# Patient Record
Sex: Male | Born: 1965 | Race: White | Hispanic: No | Marital: Married | Smoking: Never smoker
Health system: Southern US, Community
[De-identification: ages and names within clinical notes are randomized; demographics above are authoritative.]

## PROBLEM LIST (undated history)

## (undated) DIAGNOSIS — M1611 Unilateral primary osteoarthritis, right hip: Secondary | ICD-10-CM

## (undated) DIAGNOSIS — F419 Anxiety disorder, unspecified: Secondary | ICD-10-CM

## (undated) DIAGNOSIS — M48061 Spinal stenosis, lumbar region without neurogenic claudication: Secondary | ICD-10-CM

## (undated) DIAGNOSIS — Z8619 Personal history of other infectious and parasitic diseases: Secondary | ICD-10-CM

## (undated) DIAGNOSIS — R931 Abnormal findings on diagnostic imaging of heart and coronary circulation: Secondary | ICD-10-CM

## (undated) DIAGNOSIS — I499 Cardiac arrhythmia, unspecified: Secondary | ICD-10-CM

## (undated) DIAGNOSIS — I4891 Unspecified atrial fibrillation: Secondary | ICD-10-CM

## (undated) DIAGNOSIS — Z7901 Long term (current) use of anticoagulants: Secondary | ICD-10-CM

## (undated) DIAGNOSIS — I209 Angina pectoris, unspecified: Secondary | ICD-10-CM

## (undated) DIAGNOSIS — J302 Other seasonal allergic rhinitis: Secondary | ICD-10-CM

## (undated) DIAGNOSIS — L219 Seborrheic dermatitis, unspecified: Secondary | ICD-10-CM

## (undated) DIAGNOSIS — E669 Obesity, unspecified: Secondary | ICD-10-CM

## (undated) DIAGNOSIS — G4733 Obstructive sleep apnea (adult) (pediatric): Secondary | ICD-10-CM

## (undated) DIAGNOSIS — E269 Hyperaldosteronism, unspecified: Secondary | ICD-10-CM

## (undated) DIAGNOSIS — E2601 Conn's syndrome: Secondary | ICD-10-CM

## (undated) DIAGNOSIS — Z9889 Other specified postprocedural states: Secondary | ICD-10-CM

## (undated) DIAGNOSIS — D696 Thrombocytopenia, unspecified: Secondary | ICD-10-CM

## (undated) DIAGNOSIS — L409 Psoriasis, unspecified: Secondary | ICD-10-CM

## (undated) DIAGNOSIS — Z79620 Long term (current) use of immunosuppressive biologic: Secondary | ICD-10-CM

## (undated) DIAGNOSIS — M1990 Unspecified osteoarthritis, unspecified site: Secondary | ICD-10-CM

## (undated) DIAGNOSIS — I2510 Atherosclerotic heart disease of native coronary artery without angina pectoris: Secondary | ICD-10-CM

## (undated) DIAGNOSIS — I700 Atherosclerosis of aorta: Secondary | ICD-10-CM

## (undated) DIAGNOSIS — F329 Major depressive disorder, single episode, unspecified: Secondary | ICD-10-CM

## (undated) DIAGNOSIS — E119 Type 2 diabetes mellitus without complications: Secondary | ICD-10-CM

## (undated) DIAGNOSIS — I10 Essential (primary) hypertension: Secondary | ICD-10-CM

## (undated) DIAGNOSIS — G4710 Hypersomnia, unspecified: Secondary | ICD-10-CM

## (undated) DIAGNOSIS — G4730 Sleep apnea, unspecified: Secondary | ICD-10-CM

## (undated) DIAGNOSIS — I4892 Unspecified atrial flutter: Secondary | ICD-10-CM

## (undated) DIAGNOSIS — Z87898 Personal history of other specified conditions: Secondary | ICD-10-CM

## (undated) DIAGNOSIS — R945 Abnormal results of liver function studies: Secondary | ICD-10-CM

## (undated) DIAGNOSIS — E538 Deficiency of other specified B group vitamins: Secondary | ICD-10-CM

## (undated) DIAGNOSIS — F32A Depression, unspecified: Secondary | ICD-10-CM

## (undated) DIAGNOSIS — G629 Polyneuropathy, unspecified: Secondary | ICD-10-CM

## (undated) DIAGNOSIS — E1142 Type 2 diabetes mellitus with diabetic polyneuropathy: Secondary | ICD-10-CM

## (undated) DIAGNOSIS — E785 Hyperlipidemia, unspecified: Secondary | ICD-10-CM

## (undated) DIAGNOSIS — E559 Vitamin D deficiency, unspecified: Secondary | ICD-10-CM

## (undated) DIAGNOSIS — I208 Other forms of angina pectoris: Secondary | ICD-10-CM

## (undated) HISTORY — PX: VASECTOMY: SHX75

## (undated) HISTORY — DX: Unspecified osteoarthritis, unspecified site: M19.90

## (undated) HISTORY — DX: Cardiac arrhythmia, unspecified: I49.9

## (undated) HISTORY — DX: Unspecified atrial flutter: I48.92

## (undated) HISTORY — DX: Personal history of other infectious and parasitic diseases: Z86.19

## (undated) HISTORY — DX: Other seasonal allergic rhinitis: J30.2

## (undated) HISTORY — DX: Conn's syndrome: E26.01

## (undated) HISTORY — DX: Depression, unspecified: F32.A

## (undated) HISTORY — PX: OTHER SURGICAL HISTORY: SHX169

## (undated) HISTORY — DX: Major depressive disorder, single episode, unspecified: F32.9

## (undated) HISTORY — PX: COLONOSCOPY: SHX174

## (undated) HISTORY — DX: Essential (primary) hypertension: I10

## (undated) HISTORY — PX: CARDIAC ELECTROPHYSIOLOGY MAPPING AND ABLATION: SHX1292

## (undated) HISTORY — DX: Personal history of other specified conditions: Z87.898

## (undated) HISTORY — DX: Other forms of angina pectoris: I20.8

## (undated) HISTORY — DX: Other specified postprocedural states: Z98.890

## (undated) HISTORY — DX: Psoriasis, unspecified: L40.9

## (undated) HISTORY — DX: Hypersomnia, unspecified: G47.10

## (undated) HISTORY — DX: Type 2 diabetes mellitus without complications: E11.9

## (undated) HISTORY — DX: Abnormal results of liver function studies: R94.5

## (undated) HISTORY — DX: Spinal stenosis, lumbar region without neurogenic claudication: M48.061

## (undated) HISTORY — PX: TONSILLECTOMY: SUR1361

## (undated) HISTORY — PX: ADRENALECTOMY: SHX876

## (undated) HISTORY — DX: Seborrheic dermatitis, unspecified: L21.9

## (undated) HISTORY — DX: Hyperlipidemia, unspecified: E78.5

## (undated) HISTORY — DX: Sleep apnea, unspecified: G47.30

## (undated) HISTORY — DX: Anxiety disorder, unspecified: F41.9

## (undated) HISTORY — PX: UVULOPALATOPHARYNGOPLASTY: SHX827

---

## 2005-03-21 ENCOUNTER — Ambulatory Visit: Payer: Self-pay | Admitting: Unknown Physician Specialty

## 2006-02-05 ENCOUNTER — Other Ambulatory Visit: Payer: Self-pay

## 2006-02-05 ENCOUNTER — Emergency Department: Payer: Self-pay | Admitting: Emergency Medicine

## 2006-09-22 ENCOUNTER — Inpatient Hospital Stay: Payer: Self-pay | Admitting: Internal Medicine

## 2007-05-22 ENCOUNTER — Ambulatory Visit: Payer: Self-pay

## 2010-06-12 ENCOUNTER — Emergency Department: Payer: Self-pay | Admitting: Emergency Medicine

## 2013-12-05 ENCOUNTER — Ambulatory Visit: Payer: Self-pay | Admitting: Unknown Physician Specialty

## 2014-02-04 ENCOUNTER — Ambulatory Visit: Payer: Self-pay | Admitting: Unknown Physician Specialty

## 2014-02-12 HISTORY — PX: JOINT REPLACEMENT: SHX530

## 2014-02-12 HISTORY — PX: MEDIAL PARTIAL KNEE REPLACEMENT: SHX5965

## 2014-04-17 DIAGNOSIS — Z471 Aftercare following joint replacement surgery: Secondary | ICD-10-CM | POA: Insufficient documentation

## 2014-07-31 DIAGNOSIS — M545 Low back pain, unspecified: Secondary | ICD-10-CM | POA: Insufficient documentation

## 2014-07-31 DIAGNOSIS — Z96652 Presence of left artificial knee joint: Secondary | ICD-10-CM | POA: Insufficient documentation

## 2014-08-07 ENCOUNTER — Ambulatory Visit: Payer: Self-pay | Admitting: Unknown Physician Specialty

## 2014-08-22 DIAGNOSIS — M5416 Radiculopathy, lumbar region: Secondary | ICD-10-CM | POA: Insufficient documentation

## 2014-08-22 DIAGNOSIS — M48062 Spinal stenosis, lumbar region with neurogenic claudication: Secondary | ICD-10-CM | POA: Insufficient documentation

## 2016-03-10 HISTORY — PX: LAPAROSCOPIC ADRENALECTOMY: SUR752

## 2016-07-13 DIAGNOSIS — Z9889 Other specified postprocedural states: Secondary | ICD-10-CM

## 2016-07-13 HISTORY — DX: Other specified postprocedural states: Z98.890

## 2016-07-13 HISTORY — PX: CARDIAC ELECTROPHYSIOLOGY MAPPING AND ABLATION: SHX1292

## 2018-10-26 ENCOUNTER — Other Ambulatory Visit: Payer: Self-pay | Admitting: Student

## 2018-10-26 DIAGNOSIS — M5441 Lumbago with sciatica, right side: Principal | ICD-10-CM

## 2018-10-26 DIAGNOSIS — G8929 Other chronic pain: Secondary | ICD-10-CM

## 2018-10-26 DIAGNOSIS — M5442 Lumbago with sciatica, left side: Principal | ICD-10-CM

## 2018-11-19 ENCOUNTER — Ambulatory Visit
Admission: RE | Admit: 2018-11-19 | Discharge: 2018-11-19 | Disposition: A | Discharge status: Home / Self Care | Payer: PRIVATE HEALTH INSURANCE | Source: Ambulatory Visit | Attending: Student | Admitting: Student

## 2018-11-19 DIAGNOSIS — M5442 Lumbago with sciatica, left side: Secondary | ICD-10-CM | POA: Insufficient documentation

## 2018-11-19 DIAGNOSIS — M5441 Lumbago with sciatica, right side: Secondary | ICD-10-CM | POA: Diagnosis present

## 2018-11-19 DIAGNOSIS — G8929 Other chronic pain: Secondary | ICD-10-CM | POA: Insufficient documentation

## 2018-12-24 ENCOUNTER — Encounter: Payer: Self-pay | Admitting: Physical Therapy

## 2018-12-24 ENCOUNTER — Ambulatory Visit: Payer: PRIVATE HEALTH INSURANCE | Attending: Student | Admitting: Physical Therapy

## 2018-12-24 DIAGNOSIS — E785 Hyperlipidemia, unspecified: Secondary | ICD-10-CM

## 2018-12-24 DIAGNOSIS — E1159 Type 2 diabetes mellitus with other circulatory complications: Secondary | ICD-10-CM | POA: Insufficient documentation

## 2018-12-24 DIAGNOSIS — G8929 Other chronic pain: Secondary | ICD-10-CM | POA: Diagnosis present

## 2018-12-24 DIAGNOSIS — M5441 Lumbago with sciatica, right side: Secondary | ICD-10-CM | POA: Diagnosis present

## 2018-12-24 DIAGNOSIS — I10 Essential (primary) hypertension: Secondary | ICD-10-CM

## 2018-12-24 DIAGNOSIS — G4733 Obstructive sleep apnea (adult) (pediatric): Secondary | ICD-10-CM | POA: Insufficient documentation

## 2018-12-24 DIAGNOSIS — I4892 Unspecified atrial flutter: Secondary | ICD-10-CM

## 2018-12-24 DIAGNOSIS — I152 Hypertension secondary to endocrine disorders: Secondary | ICD-10-CM | POA: Insufficient documentation

## 2018-12-24 DIAGNOSIS — R262 Difficulty in walking, not elsewhere classified: Secondary | ICD-10-CM | POA: Insufficient documentation

## 2018-12-24 DIAGNOSIS — M5442 Lumbago with sciatica, left side: Secondary | ICD-10-CM | POA: Diagnosis present

## 2018-12-24 DIAGNOSIS — R202 Paresthesia of skin: Secondary | ICD-10-CM | POA: Diagnosis present

## 2018-12-24 DIAGNOSIS — G4710 Hypersomnia, unspecified: Secondary | ICD-10-CM | POA: Insufficient documentation

## 2018-12-24 DIAGNOSIS — G4730 Sleep apnea, unspecified: Secondary | ICD-10-CM

## 2018-12-24 DIAGNOSIS — L409 Psoriasis, unspecified: Secondary | ICD-10-CM | POA: Insufficient documentation

## 2018-12-24 DIAGNOSIS — M62838 Other muscle spasm: Secondary | ICD-10-CM | POA: Insufficient documentation

## 2018-12-24 DIAGNOSIS — E2609 Other primary hyperaldosteronism: Secondary | ICD-10-CM | POA: Insufficient documentation

## 2018-12-24 DIAGNOSIS — E1169 Type 2 diabetes mellitus with other specified complication: Secondary | ICD-10-CM | POA: Insufficient documentation

## 2018-12-24 DIAGNOSIS — E1142 Type 2 diabetes mellitus with diabetic polyneuropathy: Secondary | ICD-10-CM | POA: Insufficient documentation

## 2018-12-24 HISTORY — DX: Unspecified atrial flutter: I48.92

## 2018-12-24 NOTE — Therapy (Signed)
[facility] [address], [place], [address] Phone: [phone]   Fax:  [phone]  Physical Therapy Evaluation  Patient Details  Name: Cory Perez MRN: [id number] Date of Birth: 1966/10/26 Referring Provider (PT): Edwyna Perfect, [place]   Encounter Date: 12/24/2018  PT End of Session - 12/24/18 1958    Visit Number  1    Number of Visits  12    Date for PT Re-Evaluation  02/04/19    Authorization Type  [employer] reporting period from 12/24/2018    Authorization Time Period  Current Cert period: 06/21/174 - 02/04/2019 (latest PN: IE 12/24/2018)    Authorization - Visit Number  1    Authorization - Number of Visits  12    PT Start Time  1720    PT Stop Time  1815    PT Time Calculation (min)  55 min    Activity Tolerance  Patient tolerated treatment well    Behavior During Therapy  [facility] for tasks assessed/performed       Past Medical History:  Diagnosis Date  . Abnormal results of liver function studies   . Aldosterone excess (Conn syndrome) ([place])   . Anxiety   . Arrhythmia   . Atrial flutter, paroxysmal ([place]) 12/24/2018   Sp ablation 2017  . Depression   . DJD (degenerative joint disease)   . DM2 (diabetes mellitus, type 2) ([place])   . History of chicken pox   . History of syncope   . HLD (hyperlipidemia)   . HTN (hypertension)   . Hx of atrioventricular node ablation   . Hypersomnia with sleep apnea   . Lumbar spinal stenosis   . Other forms of angina pectoris ([place])    Other and unspecified angina pectoris  . Psoriasis   . Seasonal allergies   . Seborrheic dermatitis   . Sleep apnea     Past Surgical History:  Procedure Laterality Date  . ADRENALECTOMY Right 02/20/2016 - 03/20/2016  . COLONOSCOPY  12/05/2003, 09/05/2002   Dr. Chauncey Cruel. [place] @ [place] - Adenomatous Polyps, [facility] (sister)  . JOINT REPLACEMENT Left 02/12/2014   Left knee medial MAKOplasty  . UPP surgery on throat    . VASECTOMY       There were no vitals filed for this visit.     Subjective Assessment - 12/24/18 1745    Subjective  Patient states condition started about 3 years ago and has gotten proessively worse. He had an MRI about 3 years ago and it was near 2345. And it was called moderately severe. He had another 4-6 weeks ago. He has had 2 cortisone shots, first helped 2-3 days. The second one he had today. He was referred to physical therapy but Dr. Sharlet Salina at [facility] who did the injections was pretty sure he would need surgery. He will be seeing Dr. Izora Ribas as soon as they call with an appointment. He was trying to put it off. Patient reports it hurts all time time in varying degrees. It has progressed from a minor inconvenience but now hampers just about every part of his life, from work to just about everything he does. It is from impossible to very difficult. It is affecting his mood, what he can do and can't do, always thinking about it and what he is going to do. Makes things harder at home, makes things harder at work. "Generally across the board it has invaded pretty much every aspect of my  life." Now it feels like from the waste down he cannot feel much to varying degrees (points to left lower leg). Affects balance, but has not fallen. Pain shoots down both legs.  It seems like numbness and tingling is on outside of left leg, but will get pretty rough like someone is sticking an ice pick in either buttock. Also has tightness in the back across the waistline with an ashiness right in the middle. Backs of legs and calves cramp all of the time. Will also radiate up the back. Denies history of stroke, seizures, lung problems. Reports intermittent saddle paresthesia. States he sometimes has trouble with urination. Ankles feel really stiff on outer part of foot at end of day after walking on them a lot and he reports numbness on the bottoms of his feet.     Pertinent History  Patient is a 53 y.o. male who  presents to outpatient physical therapy with a referral for medical diagnosis neurogenic claudication. This patient's chief complaints consist of low back pain, leg pain and paresthesia, difficulty walking, limited activity tolerance leading to the following functional deficits: difficulty with any weight bearing activity, difficulty walking, with ADLs, IADLs, work tasks and usual household and social activities. Relevant past medical history and comorbidities include type 2 diabetes mellitus, primary aldosteronism, history of atrial flutter with ablation, left partial knee arthroplasty, anxiety, depression, sleep apnea, psoriasis, hypertension.     Limitations  Walking;Lifting;House hold activities;Standing;Other (comment)   all weight bearing activities   How long can you sit comfortably?  1- 2 hours    How long can you stand comfortably?  < 15 min    How long can you walk comfortably?  none at times    Diagnostic tests  Imaging: MRI report dated 11/20/2019: "IMPRESSION: 1. No acute osseous abnormality. Mild progression of L4-5 grade 1 anterolisthesis. 2. Progression of lumbar spondylosis greatest at L3-4 and L4-5. 3. Severe L3-4 and L4-5 spinal canal stenosis. Crowding of cauda equina above L3-4 level likely due to high-grade stenosis. 4. Moderate L3-4, moderate L4-5, and mild L5-S1 foraminal stenosis."    Patient Stated Goals  would like to be able to return to PLOF and valued activities without limitation    Currently in Pain?  Yes    Pain Score  3    Patient reports current pain as 2-3/10, at best 1-2/10, at worst 8/10.   Pain Location  Back    Pain Orientation  Mid;Lower    Pain Descriptors / Indicators  Burning;Aching;Dull;Heaviness;Cramping;Tingling;Numbness   Paresthesia: bilateral legs at varying times, degrees, and places.    Pain Type  Chronic pain    Pain Radiating Towards  radiates throughout legs to toes varying times, degree, and places.     Pain Onset  More than a month ago     Pain Frequency  Constant    Aggravating Factors   when on feet for prolonged period of time, lifting, carrying repeatedly, walking.    Pain Relieving Factors  Easing factors: sitting (unless chair is hard)    Effect of Pain on Daily Activities  Patient reports it hurts all time time in varying degrees. It has progressed from a minor inconvenience but now hampers just about every part of his life, from work to just about everything he does. It is from impossible to very difficult. It is affecting his mood, what he can do and can't do, always thinking about it and what he is going to do. Makes things harder at home, makes  things harder at work. "Generally across the board it has invaded pretty much every aspect of my life." Now it feels like from the waste down he cannot feel much to varying degrees (points to left lower leg). Affects balance, but has not fallen.         [facility] PT Assessment - 12/24/18 0001      Assessment   Medical Diagnosis  neurogenic claudication    Referring Provider (PT)  Edwyna Perfect, PA    Next MD Visit  waiting appt with Dr. Izora Ribas    Prior Therapy  none for this problem      Precautions   Precautions  None      Restrictions   Weight Bearing Restrictions  No      Balance Screen   Has the patient fallen in the past 6 months  No    Has the patient had a decrease in activity level because of a fear of falling?   No    Is the patient reluctant to leave their home because of a fear of falling?   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 50/50 deck work and physical work, doesn't do anything extremely physical except lifting those pails and that is  about 1x week.     Leisure   sit in a chair now. Used to enjoy walking and exercise but cannot really do anything now, collecting muscle cars and antiques      Cognition   Overall Cognitive Status  Within Functional Limits for tasks assessed      Observation/Other Assessments   Observations  see note from 12/24/2018 for most recent objective data    Focus on Therapeutic Outcomes (FOTO)   45         OBJECTIVE: OBSERVATION/INSPECTION: Patient presents with reduced lumbar lordosis.   NEUROLOGICAL: Dermatomes: BLE equal to light touch Myotomes: BLE apparently intact.  Reflexes:  - Quadriceps reflex (L4): R = 1+, L = 1+. - Achilles reflex (S1): R = 0, L = 0. Upper Motor Neuron Screen: Babinski, Hoffman's, and Clonus (ankle) negative bilaterally.   SPINE MOTION. Lumbar AROM:  *Indicates pain - Flexion: = to toes - Extension: = 25% (no pain after shot).  - Rotation: R = 50%, L = 50% pain on ipsilateral sides bilaterally - Side Flexion: R = 25% ipsilateral pain, L = 25% ipsilateral pain.   PERIPHERAL JOINT MOTION (AROM/PROM in degrees):  *Indicates pain - BLE apparently grossly WFL except lacking hip extension bilaterally. Formal assessment deferred.    STRENGTH:  *Indicates pain IN SITTING or SIDELYING POSITION: Hip  - Flexion: R = 5/5, L = 5/5. - Extension: R = 4+/5, L = 4+/5. - Abduction: R = 4+/5, L = 4+/5. Knee - Ext: R = 5/5, L = 5/5. Ankle (seated position) - Dorsiflexion: R = 4/5, L = 4/5. Able to heel walk - Plantarflexion: able to toe walk - Eversion: R = 5/5, L = 5/5. - Great toe extension: R = 5/5, L = 5/5   SPECIAL TESTS, ACCESSORY MOTION, and PALPATION: Not completed second to still having effects from  lidocain involved in spinal injection earlier today  FUNCTIONAL MOBILITY: - Bed mobility: Sit <> supine, and rolling side <> back I with difficulty due to pain.  - Transfers: sit <> stand I with difficulty due to pain.  - Gait: ambulates with slightly  stooped posture > 1300 feet.   FUNCTIONAL/BALANCE TESTS: Tandem walking: unable to maintain position due to LOB, but able to catch self with stepping strategy.  Backwards walking: I for 10 feet without LOB, no gross ataxia noted.  Single leg stance, firm surface, eyes open: R= 5 seconds, L= 4 seconds Six Minute Walk Test: 1325 feet    Objective measurements completed on examination: See above findings.     TREATMENT:  - Education on diagnosis, prognosis, POC, anatomy and physiology of current condition. Included education about cauda equina syndrome and importance of getting immediate care. Educated pt that if the paresthesia he feels in the saddle region becomes constant or worsens at any point to seek immediate care. Also advised to take these actions if he finds he cannot void urine. Educated him about the risk of permanent damage if treatment is delayed in that case. Educated pt on the need for lidocaine numbness to wear off before prescribing HEP or finishing objective exam due to lack of ability to assess response needed for safe assessment of his condition.      PT Education - 12/24/18 1958    Education Details  - Education on diagnosis, prognosis, POC, anatomy and physiology of current condition. Included education about cauda equina syndrome and importance of getting immediate care. Educated pt that if the paresthesia he feels in the saddle region becomes constant or worsens at any point to seek immediate care. Also advised to take these actions if he finds he cannot void urine. Educated him about the risk of permanent damage if treatment is delayed in that case. Educated pt on the need for lidocaine numbness to wear off before prescribing HEP or finishing objective exam due to lack of ability to assess response needed for safe assessment of his condition.     Person(s) Educated  Patient    Methods  Explanation    Comprehension  Verbalized understanding       PT Short Term Goals -  12/24/18 1959      PT SHORT TERM GOAL #1   Title  Be independent with initial home exercise program for self-management of symptoms.    Baseline  Initial HEP to be provided at 2nd visit    Time  2    Period  Weeks    Status  New    Target Date  01/07/19        PT Long Term Goals - 12/24/18 2000      PT LONG TERM GOAL #1   Title  Be independent with a long-term home exercise program for self-management of symptoms.     Baseline  Initial HEP to be provied at 2nd visit (12/24/2018);    Time  6    Period  Weeks    Status  New    Target Date  02/04/19      PT LONG TERM GOAL #2   Title  Demonstrate improved FOTO score by 10 units to demonstrate improvement in overall condition and self-reported functional ability.     Baseline  FOTO = 45 (12/24/2018);     Time  6    Period  Weeks    Status  New    Target Date  02/04/19  PT LONG TERM GOAL #3   Title  Reduce pain to 3/10 with functional activities to allow patient to complete valued functional tasks such as walking with less difficulty    Baseline  pain up to 8/10 (12/24/2018);     Time  6    Period  Weeks    Status  New    Target Date  02/04/19      PT LONG TERM GOAL #4   Title  Patient will improve standing/walking tolerance to equal or greater than 1 hour to improve ability to perform work duties.     Baseline  0-15 min (12/24/2018);     Time  6    Period  Weeks    Status  New      PT LONG TERM GOAL #5   Title  Complete community, work and/or recreational activities without limitation due to current condition.     Baseline  very limited in all weight bearing activities (see subjective exam, 12/24/2018);     Time  8    Period  Weeks    Status  New    Target Date  02/04/19             Plan - 12/24/18 1952    Clinical Impression Statement  Patient is a 53 y.o. male referred to outpatient physical therapy with a medical diagnosis of neurogenic claudication who presents with signs and symptoms consistent with low back  pain, bilateral hip and leg pain, and difficulty walking consistent with spinal stenosis. Pt also reports intermittent saddle paresthesia and decreased ability to void urine that suggests his cauda equina may be experiencing intermittent irritation with supporting MRI evidence. Patient also appears to be suffering from effects of peripheral neuropathy that overlaps paresthesia associated with lumbar pathology.  Patient presents with significant pain, stiffness, ROM loss, poor standing activity tolerance, and sensation impairments that are limiting ability to complete ambulation, standing activities, work activities, transfers, bending, lifting, household responsibilities and social/community participation without difficulty. Patient will benefit from skilled physical therapy intervention to address current body structure impairments and activity limitations to improve function and work towards goals set in current POC in order to return to prior level of function or maximal functional improvement. Patient would also benefit from further medical assessment for spinal cord and cauda equina compression. Patient's referring clinician has been notified of symptoms consistent with cauda equina syndrome    History and Personal Factors relevant to plan of care:   type 2 diabetes mellitus, primary aldosteronism, history of atrial flutter with ablation, left partial knee arthroplasty, anxiety, depression, sleep apnea, psoriasis, hypertension, MRI shows encroachment on cauda equina.     Clinical Presentation  Evolving    Clinical Presentation due to:  patient reports worsening over time    Clinical Decision Making  Moderate    Rehab Potential  Fair    Clinical Impairments Affecting Rehab Potential  (+) motivation, (-) severity and chronicity of condition, comorbidities, MRI findings showing encroachment on cauda equina    PT Frequency  2x / week    PT Duration  6 weeks    PT Treatment/Interventions  ADLs/Self Care  Home Management;Aquatic Therapy;Moist Heat;Cryotherapy;Electrical Stimulation;Gait training;Stair training;Functional mobility training;Therapeutic activities;Therapeutic exercise;Balance training;Neuromuscular re-education;Patient/family education;Manual techniques;Passive range of motion;Dry needling;Joint Manipulations;Other (comment)   joint mobilizations grades I-IV   PT Next Visit Plan  re-assess objective measures when pt is not under influence of lidocane and issue HEP    PT Home Exercise Plan  to be provied at  2nd visit    Recommended Other Services  further medical evaluation by spinal surgeon/physician        Patient will benefit from skilled therapeutic intervention in order to improve the following deficits and impairments:  Abnormal gait, Decreased balance, Decreased endurance, Decreased mobility, Difficulty walking, Hypomobility, Increased muscle spasms, Impaired sensation, Decreased range of motion, Impaired perceived functional ability, Obesity, Decreased activity tolerance, Impaired flexibility, Pain  Visit Diagnosis: Chronic bilateral low back pain with bilateral sciatica  Paresthesia of skin  Other muscle spasm  Difficulty in walking, not elsewhere classified     Problem List Patient Active Problem List   Diagnosis Date Noted  . Atrial flutter, paroxysmal ([place]) 12/24/2018  . OSA (obstructive sleep apnea) 12/24/2018  . Psoriasis 12/24/2018  . Primary hyperaldosteronism ([place]) 12/24/2018  . Type 2 diabetes mellitus with diabetic polyneuropathy, without long-term current use of insulin ([place]) 12/24/2018  . Hypertension associated with diabetes ([place]) 12/24/2018  . Hyperlipidemia associated with type 2 diabetes mellitus ([place]) 12/24/2018  . Hypersomnia with sleep apnea 12/24/2018  . Lumbar stenosis with neurogenic claudication 08/22/2014  . Lumbar radiculitis 08/22/2014  . Status post left partial knee replacement 07/31/2014  . Low back pain 07/31/2014  . Aftercare  following joint replacement 04/17/2014    Nancy Nordmann, PT, DPT 12/24/2018, 8:04 PM  [facility] [address] [place], [address] Phone: [phone]   Fax:  [phone]  Name: Cory Perez MRN: [id number] Date of Birth: 02-13-1966

## 2018-12-26 ENCOUNTER — Ambulatory Visit: Payer: PRIVATE HEALTH INSURANCE | Admitting: Physical Therapy

## 2018-12-26 DIAGNOSIS — M62838 Other muscle spasm: Secondary | ICD-10-CM

## 2018-12-26 DIAGNOSIS — G8929 Other chronic pain: Secondary | ICD-10-CM

## 2018-12-26 DIAGNOSIS — M5441 Lumbago with sciatica, right side: Principal | ICD-10-CM

## 2018-12-26 DIAGNOSIS — R262 Difficulty in walking, not elsewhere classified: Secondary | ICD-10-CM

## 2018-12-26 DIAGNOSIS — R202 Paresthesia of skin: Secondary | ICD-10-CM

## 2018-12-26 DIAGNOSIS — M5442 Lumbago with sciatica, left side: Principal | ICD-10-CM

## 2018-12-26 NOTE — Therapy (Signed)
[facility] [address] [place], [address] Phone: [phone]   Fax:  [phone]  Physical Therapy Treatment  Patient Details  Name: Cory Perez MRN: [id number] Date of Birth: 1966/04/17 Referring Provider (PT): Edwyna Perfect, [place]   Encounter Date: 12/26/2018  PT End of Session - 12/26/18 1946    Visit Number  2    Number of Visits  12    Date for PT Re-Evaluation  02/04/19    Authorization Type  [employer] reporting period from 12/24/2018    Authorization Time Period  Current Cert period: 0/12/4095 - 02/04/2019 (latest PN: IE 12/24/2018)    Authorization - Visit Number  2    Authorization - Number of Visits  12    PT Start Time  1735    PT Stop Time  1820    PT Time Calculation (min)  45 min    Activity Tolerance  Patient tolerated treatment well    Behavior During Therapy  [facility] for tasks assessed/performed       Past Medical History:  Diagnosis Date  . Abnormal results of liver function studies   . Aldosterone excess (Conn syndrome) ([place])   . Anxiety   . Arrhythmia   . Atrial flutter, paroxysmal ([place]) 12/24/2018   Sp ablation 2017  . Depression   . DJD (degenerative joint disease)   . DM2 (diabetes mellitus, type 2) ([place])   . History of chicken pox   . History of syncope   . HLD (hyperlipidemia)   . HTN (hypertension)   . Hx of atrioventricular node ablation   . Hypersomnia with sleep apnea   . Lumbar spinal stenosis   . Other forms of angina pectoris ([place])    Other and unspecified angina pectoris  . Psoriasis   . Seasonal allergies   . Seborrheic dermatitis   . Sleep apnea     Past Surgical History:  Procedure Laterality Date  . ADRENALECTOMY Right 02/20/2016 - 03/20/2016  . COLONOSCOPY  12/05/2003, 09/05/2002   Dr. Chauncey Cruel. [place] @ [facility] - Adenomatous Polyps, [facility] (sister)  . JOINT REPLACEMENT Left 02/12/2014   Left knee medial MAKOplasty  . UPP surgery on throat    . VASECTOMY       There were no vitals filed for this visit.  Subjective Assessment - 12/26/18 1738    Subjective  Patient reports he continues to have numbness down the left lateral lower leg (feels like a shin splint). He feels like his leg is wet and cool there. His pain upon arrival is 3/10 in the lower lumbar region. He took it easy today.      Pertinent History  Patient is a 53 y.o. male who presents to outpatient physical therapy with a referral for medical diagnosis neurogenic claudication. This patient's chief complaints consist of low back pain, leg pain and paresthesia, difficulty walking, limited activity tolerance leading to the following functional deficits: difficulty with any weight bearing activity, difficulty walking, with ADLs, IADLs, work tasks and usual household and social activities. Relevant past medical history and comorbidities include type 2 diabetes mellitus, primary aldosteronism, history of atrial flutter with ablation, left partial knee arthroplasty, anxiety, depression, sleep apnea, psoriasis, hypertension.     Limitations  Walking;Lifting;House hold activities;Standing;Other (comment)   all weight bearing activities   How long can you sit comfortably?  1- 2 hours    How long can you stand comfortably?  < 15 min    How  long can you walk comfortably?  none at times    Diagnostic tests  Imaging: MRI report dated 11/20/2019: "IMPRESSION: 1. No acute osseous abnormality. Mild progression of L4-5 grade 1 anterolisthesis. 2. Progression of lumbar spondylosis greatest at L3-4 and L4-5. 3. Severe L3-4 and L4-5 spinal canal stenosis. Crowding of cauda equina above L3-4 level likely due to high-grade stenosis. 4. Moderate L3-4, moderate L4-5, and mild L5-S1 foraminal stenosis."    Patient Stated Goals  would like to be able to return to PLOF and valued activities without limitation    Currently in Pain?  Yes    Pain Score  3     Pain Location  Back    Pain Orientation  Lower    Pain  Descriptors / Indicators  Aching    Pain Radiating Towards  paresthesia in left shin and bottoms of both feet.     Pain Onset  More than a month ago      OBJECTIVE: OBSERVATION/INSPECTION: Patient presents with reduced lumbar lordosis.   NEUROLOGICAL: Dermatomes: BLE equal to light touch, diminished near L5 bilaterally.  Myotomes: BLE apparently intact.  Reflexes:   Quadriceps reflex (L4): R = 2+, L = 2+.  Achilles reflex (S1): R = 0, L = 0. Upper Motor Neuron Screen: Babinski, Hoffman's, and Clonus (ankle) negative bilaterally.   SPINE MOTION. Lumbar AROM:  *Indicates pain  Flexion: = to toes  Extension: = 25% (painful at low back).   REPEATED MOTIONS TESTING - repeated extension in standing, possible decrease in left lower leg paresthesia and and pain starting to radiate towards hips. (states this used to make his legs very much worse prior to steroid injection 2 days ago) - repeated flexion in standing; during = increasing ROM, no pain in back. After possibly better. Abolished most of hip pain.   TREATMENT:  Therapeutic exercise: to centralize symptoms and improve ROM, strength, muscular endurance, and activity tolerance required for successful completion of functional activities.  - objective examination to get updated measures after lidocaine from injection has worn off (see above).  - supine double knees to chest with self overpressure, 3 second holds x 10. Cuing for end range and technique. To stretch low back - hooklying lower trunk rotation to improve low back mobility and core activation. Cuing to relax when knees are over and use abdominals to help return to neutral. Reassurance feeling of stretch is okay. 3 second hold X15 to each side. - hooklying pelvic tilt AAROM anterior/posterior to improve low back mobility and increase postural awareness and abdominal activation. Cuing for motion. X 20 - hooklying articulated bridge to improve posterior trunk and hip  extension strength while encouraging mild lumbar flexion to decrease pain. Unable to perform correctly and caused pain when lifting hips maximally. Cuing to decrease range to improve comfort and form. X 10 - Hooklying sciatic nerve mobilization holding leg in 90/90 position and extending foot to ceiling. To decrease paresthesia and improve nerve mobility. Able to tolerate floss and stretch well, used towel behind knee to allow pt to reach while resting trunk and head on mat. Cuing for foot position. X 15 each side.  - education about peripheral neuropathy vs lumbar radiculopathy and advice for managing peripheral neuropathy.  - Education on HEP including handout    HOME EXERCISE PROGRAM Access Code: [email or web address]  URL: [email or web address]  Date: 12/26/2018  Prepared by: Rosita Kea   Exercises  Supine Lower Trunk Rotation - 20 reps - 3 seconds hold - 1 Sets -  1-2x daily - 7x weekly  Supine Double Knee to Chest - 20 reps - 3 second hold - 1 Sets - 1-2x daily - 7x weekly  Supine Pelvic Tilt - 20 reps - 1 second hold - 1 Sets - 1-2x daily - 7x weekly  Supine LE Neural Mobilization - 15 reps - 1 second hold - 1 Sets - 1x daily - 7x weekly      Patient response to treatment:  Pt tolerated treatment well. Pt was able to complete all exercises with minimal to no lasting increase in pain or discomfort. Pt required multimodal cuing for proper technique and to facilitate improved neuromuscular control, strength, range of motion, and functional ability resulting in improved performance and form. Pt reported decreased stiffness and left sided paresthesia but reported increased right plantar foot paresthesia by end of session.        PT Education - 12/26/18 1945    Education Details  Exercise purpose/form. Self management techniques. Education on diagnosis, prognosis, POC, anatomy and physiology of current condition Education on HEP including handout. - education about peripheral  neuropathy vs lumbar radiculopathy and advice for managing peripheral neuropathy     Person(s) Educated  Patient    Methods  Explanation;Demonstration;Tactile cues;Verbal cues;Handout    Comprehension  Returned demonstration;Verbalized understanding       PT Short Term Goals - 12/24/18 1959      PT SHORT TERM GOAL #1   Title  Be independent with initial home exercise program for self-management of symptoms.    Baseline  Initial HEP to be provided at 2nd visit    Time  2    Period  Weeks    Status  New    Target Date  01/07/19        PT Long Term Goals - 12/24/18 2000      PT LONG TERM GOAL #1   Title  Be independent with a long-term home exercise program for self-management of symptoms.     Baseline  Initial HEP to be provied at 2nd visit (12/24/2018);    Time  6    Period  Weeks    Status  New    Target Date  02/04/19      PT LONG TERM GOAL #2   Title  Demonstrate improved FOTO score by 10 units to demonstrate improvement in overall condition and self-reported functional ability.     Baseline  FOTO = 45 (12/24/2018);     Time  6    Period  Weeks    Status  New    Target Date  02/04/19      PT LONG TERM GOAL #3   Title  Reduce pain to 3/10 with functional activities to allow patient to complete valued functional tasks such as walking with less difficulty    Baseline  pain up to 8/10 (12/24/2018);     Time  6    Period  Weeks    Status  New    Target Date  02/04/19      PT LONG TERM GOAL #4   Title  Patient will improve standing/walking tolerance to equal or greater than 1 hour to improve ability to perform work duties.     Baseline  0-15 min (12/24/2018);     Time  6    Period  Weeks    Status  New      PT LONG TERM GOAL #5   Title  Complete community, work and/or recreational activities without limitation due  to current condition.     Baseline  very limited in all weight bearing activities (see subjective exam, 12/24/2018);     Time  8    Period  Weeks    Status  New     Target Date  02/04/19            Plan - 12/26/18 1946    Clinical Impression Statement  Patient has attended 2 physical therapy sessions so far this episode of care. He was re-examined for some objective measures today due to no longer being under the influence of lidocaine from the injection 2 days ago. His medical provider has been in touch and he is planning to see Dr. Izora Ribas tomorrow. He thinks he may have misunderstood questions about saddle paresthesia last session and seems to indicate that he only felt that when he had the lidocaine affecting him. Patient tolerated exercises well and does appear to respond better to lumbar flexion than extension. Patient is a 53 y.o. male referred to outpatient physical therapy with a medical diagnosis of neurogenic claudication who presents with signs and symptoms consistent with low back pain, bilateral hip and leg pain, and difficulty walking consistent with spinal stenosis. Pt also reports intermittent saddle paresthesia and decreased ability to void urine that suggests his cauda equina may be experiencing intermittent irritation with supporting MRI evidence. Patient also appears to be suffering from effects of peripheral neuropathy that overlaps paresthesia associated with lumbar pathology. Patient presents with significant pain, stiffness, ROM loss, poor standing activity tolerance, and sensation impairments that are limiting ability to complete ambulation, standing activities, work activities, transfers, bending, lifting, household responsibilities and social/community participation without difficulty. Patient will benefit from skilled physical therapy intervention to address current body structure impairments and activity limitations to improve function and work towards goals set in current POC in order to return to prior level of function or maximal functional improvement. Patient would also benefit from further medical assessment for spinal cord and  cauda equina compression. Patient's referring clinician has been notified of symptoms consistent with cauda equina syndrome    Rehab Potential  Fair    Clinical Impairments Affecting Rehab Potential  (+) motivation, (-) severity and chronicity of condition, comorbidities, MRI findings showing encroachment on cauda equina    PT Frequency  2x / week    PT Duration  6 weeks    PT Treatment/Interventions  ADLs/Self Care Home Management;Aquatic Therapy;Moist Heat;Cryotherapy;Electrical Stimulation;Gait training;Stair training;Functional mobility training;Therapeutic activities;Therapeutic exercise;Balance training;Neuromuscular re-education;Patient/family education;Manual techniques;Passive range of motion;Dry needling;Joint Manipulations;Other (comment)   joint mobilizations grades I-IV   PT Next Visit Plan  continue progressive mobility and strengthening as tolerated.     PT Home Exercise Plan  Medbridge Access Code: [email or web address]     Consulted and Agree with Plan of Care  Patient       Patient will benefit from skilled therapeutic intervention in order to improve the following deficits and impairments:  Abnormal gait, Decreased balance, Decreased endurance, Decreased mobility, Difficulty walking, Hypomobility, Increased muscle spasms, Impaired sensation, Decreased range of motion, Impaired perceived functional ability, Obesity, Decreased activity tolerance, Impaired flexibility, Pain  Visit Diagnosis: Chronic bilateral low back pain with bilateral sciatica  Paresthesia of skin  Other muscle spasm  Difficulty in walking, not elsewhere classified     Problem List Patient Active Problem List   Diagnosis Date Noted  . Atrial flutter, paroxysmal ([place]) 12/24/2018  . OSA (obstructive sleep apnea) 12/24/2018  . Psoriasis 12/24/2018  . Primary hyperaldosteronism ([place]) 12/24/2018  .  Type 2 diabetes mellitus with diabetic polyneuropathy, without long-term current use of insulin ([place]) 12/24/2018  .  Hypertension associated with diabetes ([place]) 12/24/2018  . Hyperlipidemia associated with type 2 diabetes mellitus ([place]) 12/24/2018  . Hypersomnia with sleep apnea 12/24/2018  . Lumbar stenosis with neurogenic claudication 08/22/2014  . Lumbar radiculitis 08/22/2014  . Status post left partial knee replacement 07/31/2014  . Low back pain 07/31/2014  . Aftercare following joint replacement 04/17/2014    Nancy Nordmann, PT, DPT 12/26/2018, 7:50 PM  [facility] [facility] [address] [place], [address] Phone: [phone]   Fax:  [phone]  Name: Cory Perez MRN: [id number] Date of Birth: March 20, 1966

## 2018-12-31 ENCOUNTER — Encounter: Payer: Self-pay | Admitting: Physical Therapy

## 2018-12-31 ENCOUNTER — Ambulatory Visit: Payer: PRIVATE HEALTH INSURANCE | Admitting: Physical Therapy

## 2018-12-31 ENCOUNTER — Telehealth: Payer: Self-pay | Admitting: Physical Therapy

## 2018-12-31 DIAGNOSIS — M5441 Lumbago with sciatica, right side: Principal | ICD-10-CM

## 2018-12-31 DIAGNOSIS — M62838 Other muscle spasm: Secondary | ICD-10-CM

## 2018-12-31 DIAGNOSIS — R202 Paresthesia of skin: Secondary | ICD-10-CM

## 2018-12-31 DIAGNOSIS — M5442 Lumbago with sciatica, left side: Secondary | ICD-10-CM | POA: Diagnosis not present

## 2018-12-31 DIAGNOSIS — R262 Difficulty in walking, not elsewhere classified: Secondary | ICD-10-CM

## 2018-12-31 DIAGNOSIS — G8929 Other chronic pain: Secondary | ICD-10-CM

## 2018-12-31 NOTE — Therapy (Signed)
[facility] [address], [place], [address] Phone: [phone]   Fax:  [phone]  Physical Therapy Treatment  Patient Details  Name: Cory Perez MRN: [id number] Date of Birth: Dec 11, 1965 Referring Provider (PT): Edwyna Perfect, [place]   Encounter Date: 12/31/2018  PT End of Session - 01/01/19 1343    Visit Number  3    Number of Visits  12    Date for PT Re-Evaluation  02/04/19    Authorization Type  [employer] reporting period from 12/24/2018    Authorization Time Period  Current Cert period: 06/25/8849 - 02/04/2019 (latest PN: IE 12/24/2018)    Authorization - Visit Number  3    Authorization - Number of Visits  12    PT Start Time  2774    PT Stop Time  1840    PT Time Calculation (min)  45 min    Activity Tolerance  Patient tolerated treatment well    Behavior During Therapy  [facility] for tasks assessed/performed       Past Medical History:  Diagnosis Date  . Abnormal results of liver function studies   . Aldosterone excess (Conn syndrome) ([place])   . Anxiety   . Arrhythmia   . Atrial flutter, paroxysmal ([place]) 12/24/2018   Sp ablation 2017  . Depression   . DJD (degenerative joint disease)   . DM2 (diabetes mellitus, type 2) ([place])   . History of chicken pox   . History of syncope   . HLD (hyperlipidemia)   . HTN (hypertension)   . Hx of atrioventricular node ablation   . Hypersomnia with sleep apnea   . Lumbar spinal stenosis   . Other forms of angina pectoris ([place])    Other and unspecified angina pectoris  . Psoriasis   . Seasonal allergies   . Seborrheic dermatitis   . Sleep apnea     Past Surgical History:  Procedure Laterality Date  . ADRENALECTOMY Right 02/20/2016 - 03/20/2016  . COLONOSCOPY  12/05/2003, 09/05/2002   Dr. Chauncey Cruel. [place] @ [place] - Adenomatous Polyps, [facility] (sister)  . JOINT REPLACEMENT Left 02/12/2014   Left knee medial MAKOplasty  . UPP surgery on throat    . VASECTOMY       There were no vitals filed for this visit.  Subjective Assessment - 12/31/18 1800    Subjective  Patient reports his pain is 2-3/10 in the low back and upper buttocks. He states he thinks the second shot has lasted a bit better than his first shot (which started hurting more 2-3 days after). He denies distal numbness today.  He saw Dr. Izora Ribas this week who set up surgery in March for him but encouraged him to continue with physical tharapy leading up to it to improve his condition going in and to maximize symptom relief and function. He reports he did not do any HEP following last session due to being very busy. He reports he thinks the double legs to chest exercise or one of the other exercises may have bothered his back and he can still feel elevated pain.     Pertinent History  Patient is a 53 y.o. male who presents to outpatient physical therapy with a referral for medical diagnosis neurogenic claudication. This patient's chief complaints consist of low back pain, leg pain and paresthesia, difficulty walking, limited activity tolerance leading to the following functional deficits: difficulty with any weight bearing activity, difficulty walking, with ADLs, IADLs, work tasks  and usual household and social activities. Relevant past medical history and comorbidities include type 2 diabetes mellitus, primary aldosteronism, history of atrial flutter with ablation, left partial knee arthroplasty, anxiety, depression, sleep apnea, psoriasis, hypertension.     Limitations  Walking;Lifting;House hold activities;Standing;Other (comment)   all weight bearing activities   How long can you sit comfortably?  1- 2 hours    How long can you stand comfortably?  < 15 min    How long can you walk comfortably?  none at times    Diagnostic tests  Imaging: MRI report dated 11/20/2019: "IMPRESSION: 1. No acute osseous abnormality. Mild progression of L4-5 grade 1 anterolisthesis. 2. Progression of lumbar spondylosis  greatest at L3-4 and L4-5. 3. Severe L3-4 and L4-5 spinal canal stenosis. Crowding of cauda equina above L3-4 level likely due to high-grade stenosis. 4. Moderate L3-4, moderate L4-5, and mild L5-S1 foraminal stenosis."    Patient Stated Goals  would like to be able to return to PLOF and valued activities without limitation    Currently in Pain?  Yes    Pain Score  2     Pain Location  Back    Pain Orientation  Posterior;Lower    Pain Descriptors / Indicators  Aching    Pain Onset  More than a month ago       TREATMENT: Therapeutic exercise:to centralize symptoms and improve ROM, strength, muscular endurance, and activity tolerance required for successful completion of functional activities.  -  NuStep level 10 using bilateral upper and lower extremities. Seate and hand setting 16. For improved extremity mobility, muscular endurance, and activity tolerance; and to induce the analgesic effect of aerobic exercise, stimulate improved joint nutrition, and prepare body structures and systems for following interventions. x 6.5  Minutes during subjective exam. - hooklying lower trunk rotation to improve low back mobility and core activation. Cuing to relax when knees are over and use abdominals to help return to neutral. Reassurance feeling of stretch is okay. 3 second hold x 2 minutes. Added theraball under legs. Pt started to complain of right sided upper lumbar/lower thoracic pain.  - hooklying double knees towards chest (without overpressure) with heels on theraball to improve core strength and activation. Cuing for technique. X 2 min plus time for transitions and instruction. Tolerable.  - seated thoracic extension in chair with manaul overpressure x 15 to improve motion and decrease pain. Cuing for technique.  - foam roller wall slides to stretch thoracic spine and bilateral lats, 3 second hold, x 10. Cuing for technique.   Manual therapy: to reduce pain and tissue tension, improve range of  motion, neuromodulation, in order to promote improved ability to complete functional activities. - Prone CPA along lumbar and thoracic spine grade I-IV with and without wedge assist focusing on tender points and sections pt reports relieves pain and tension. Also performed UPA at right lower thoracic region near T10-T12 where pt reported feeling pain that radiates towards the test when moving.  - STM with and without foam roller assist over posterior spinal muscles of the lumbar and thoracic spine.    HOME EXERCISE PROGRAM Access Code: [email or web address]  URL: [email or web address]  Date: 12/26/2018  Prepared by: Rosita Kea   Exercises   Supine Lower Trunk Rotation - 20 reps - 3 seconds hold - 1 Sets - 1-2x daily - 7x weekly   Supine Double Knee to Chest - 20 reps - 3 second hold - 1 Sets - 1-2x daily - 7x weekly  Supine Pelvic Tilt - 20 reps - 1 second hold - 1 Sets - 1-2x daily - 7x weekly   Supine LE Neural Mobilization - 15 reps - 1 second hold - 1 Sets - 1x daily - 7x weekly     Patient response to treatment:  Pt tolerated treatment fair. He had increased pain in specific location near right lower thoracic spine that worsened with LTR at greater ranges and made bed mobility difficult. Intermittent pain only with certain motions. Thoracic extension exercises and manual therapy improved pain and tension in spinal region, but did not abolish pain at right sided lower thoracic region. Pt reported he felt better by end of session. He reported decreased buttocks pain in prone with two pillows under his abdomen/hip region. Pt required multimodal cuing for proper technique and to facilitate improved neuromuscular control, strength, range of motion, and functional ability resulting in improved performance and form.     PT Short Term Goals - 12/24/18 1959      PT SHORT TERM GOAL #1   Title  Be independent with initial home exercise program for self-management of symptoms.     Baseline  Initial HEP to be provided at 2nd visit    Time  2    Period  Weeks    Status  New    Target Date  01/07/19        PT Long Term Goals - 12/24/18 2000      PT LONG TERM GOAL #1   Title  Be independent with a long-term home exercise program for self-management of symptoms.     Baseline  Initial HEP to be provied at 2nd visit (12/24/2018);    Time  6    Period  Weeks    Status  New    Target Date  02/04/19      PT LONG TERM GOAL #2   Title  Demonstrate improved FOTO score by 10 units to demonstrate improvement in overall condition and self-reported functional ability.     Baseline  FOTO = 45 (12/24/2018);     Time  6    Period  Weeks    Status  New    Target Date  02/04/19      PT LONG TERM GOAL #3   Title  Reduce pain to 3/10 with functional activities to allow patient to complete valued functional tasks such as walking with less difficulty    Baseline  pain up to 8/10 (12/24/2018);     Time  6    Period  Weeks    Status  New    Target Date  02/04/19      PT LONG TERM GOAL #4   Title  Patient will improve standing/walking tolerance to equal or greater than 1 hour to improve ability to perform work duties.     Baseline  0-15 min (12/24/2018);     Time  6    Period  Weeks    Status  New      PT LONG TERM GOAL #5   Title  Complete community, work and/or recreational activities without limitation due to current condition.     Baseline  very limited in all weight bearing activities (see subjective exam, 12/24/2018);     Time  8    Period  Weeks    Status  New    Target Date  02/04/19            Plan - 01/01/19 1344    Clinical Impression  Statement   Patient has attended 3 physical therapy sessions so far this episode of care. He had more difficulty with exercises today but got relief from prone position with two pillows under abdomen and manual therapy. Patient is a 53 y.o. male referred to outpatient physical therapy with a medical diagnosis of neurogenic  claudication who presents with signs and symptoms consistent with low back pain, bilateral hip and leg pain, and difficulty walking consistent with spinal stenosis.  Patient presents with significant pain, stiffness, ROM loss, poor standing activity tolerance, and sensation impairments that are limiting ability to complete ambulation, standing activities, work activities, transfers, bending, lifting, household responsibilities and social/community participation without difficulty. Patient will benefit from skilled physical therapy intervention to address current body structure impairments and activity limitations to improve function and work towards goals set in current POC in order to return to prior level of function or maximal functional improvement. Patient would also benefit from further medical assessment for spinal cord and cauda equina compression. Patient's referring clinician has been notified of symptoms consistent with cauda equina syndrome    Rehab Potential  Fair    Clinical Impairments Affecting Rehab Potential  (+) motivation, (-) severity and chronicity of condition, comorbidities, MRI findings showing encroachment on cauda equina    PT Frequency  2x / week    PT Duration  6 weeks    PT Treatment/Interventions  ADLs/Self Care Home Management;Aquatic Therapy;Moist Heat;Cryotherapy;Electrical Stimulation;Gait training;Stair training;Functional mobility training;Therapeutic activities;Therapeutic exercise;Balance training;Neuromuscular re-education;Patient/family education;Manual techniques;Passive range of motion;Dry needling;Joint Manipulations;Other (comment)   joint mobilizations grades I-IV   PT Next Visit Plan  continue progressive mobility and strengthening as tolerated.     PT Home Exercise Plan  Medbridge Access Code: [email or web address]     Consulted and Agree with Plan of Care  Patient       Patient will benefit from skilled therapeutic intervention in order to improve the following  deficits and impairments:  Abnormal gait, Decreased balance, Decreased endurance, Decreased mobility, Difficulty walking, Hypomobility, Increased muscle spasms, Impaired sensation, Decreased range of motion, Impaired perceived functional ability, Obesity, Decreased activity tolerance, Impaired flexibility, Pain  Visit Diagnosis: Chronic bilateral low back pain with bilateral sciatica  Paresthesia of skin  Other muscle spasm  Difficulty in walking, not elsewhere classified     Problem List Patient Active Problem List   Diagnosis Date Noted  . Atrial flutter, paroxysmal ([place]) 12/24/2018  . OSA (obstructive sleep apnea) 12/24/2018  . Psoriasis 12/24/2018  . Primary hyperaldosteronism ([place]) 12/24/2018  . Type 2 diabetes mellitus with diabetic polyneuropathy, without long-term current use of insulin ([place]) 12/24/2018  . Hypertension associated with diabetes ([place]) 12/24/2018  . Hyperlipidemia associated with type 2 diabetes mellitus ([place]) 12/24/2018  . Hypersomnia with sleep apnea 12/24/2018  . Lumbar stenosis with neurogenic claudication 08/22/2014  . Lumbar radiculitis 08/22/2014  . Status post left partial knee replacement 07/31/2014  . Low back pain 07/31/2014  . Aftercare following joint replacement 04/17/2014    Nancy Nordmann, PT, DPT 01/01/2019, 1:45 PM  [facility] [address], [place], [address] Phone: [phone]   Fax:  [phone]  Name: JAQUANN GUARISCO MRN: [id number] Date of Birth: 1966/02/21

## 2018-12-31 NOTE — Telephone Encounter (Signed)
Called pt to check on him when he did not arrive on time for today's appt at 5:45pm. Pt answered in the lobby and said he had just arrived.

## 2019-01-02 ENCOUNTER — Ambulatory Visit: Payer: PRIVATE HEALTH INSURANCE | Admitting: Physical Therapy

## 2019-01-02 DIAGNOSIS — R262 Difficulty in walking, not elsewhere classified: Secondary | ICD-10-CM

## 2019-01-02 DIAGNOSIS — M5442 Lumbago with sciatica, left side: Principal | ICD-10-CM

## 2019-01-02 DIAGNOSIS — R202 Paresthesia of skin: Secondary | ICD-10-CM

## 2019-01-02 DIAGNOSIS — M62838 Other muscle spasm: Secondary | ICD-10-CM

## 2019-01-02 DIAGNOSIS — G8929 Other chronic pain: Secondary | ICD-10-CM

## 2019-01-02 DIAGNOSIS — M5441 Lumbago with sciatica, right side: Principal | ICD-10-CM

## 2019-01-02 NOTE — Therapy (Signed)
[facility] [address] [place], [address] Phone: [phone]   Fax:  [phone]  Physical Therapy Treatment  Patient Details  Name: Cory Perez MRN: [id number] Date of Birth: [birth year] Referring Provider (PT): Edwyna Perfect, [place]   Encounter Date: 01/02/2019  PT End of Session - 01/03/19 1610    Visit Number  4    Number of Visits  12    Date for PT Re-Evaluation  02/04/19    Authorization Type  [employer] reporting period from 12/24/2018    Authorization Time Period  Current Cert period: 0/11/930 - 02/04/2019 (latest PN: IE 12/24/2018)    Authorization - Visit Number  4    Authorization - Number of Visits  12    PT Start Time  3557    PT Stop Time  1800    PT Time Calculation (min)  45 min    Activity Tolerance  Patient tolerated treatment well    Behavior During Therapy  [facility] for tasks assessed/performed       Past Medical History:  Diagnosis Date  . Abnormal results of liver function studies   . Aldosterone excess (Conn syndrome) ([place])   . Anxiety   . Arrhythmia   . Atrial flutter, paroxysmal ([place]) 12/24/2018   Sp ablation 2017  . Depression   . DJD (degenerative joint disease)   . DM2 (diabetes mellitus, type 2) ([place])   . History of chicken pox   . History of syncope   . HLD (hyperlipidemia)   . HTN (hypertension)   . Hx of atrioventricular node ablation   . Hypersomnia with sleep apnea   . Lumbar spinal stenosis   . Other forms of angina pectoris ([place])    Other and unspecified angina pectoris  . Psoriasis   . Seasonal allergies   . Seborrheic dermatitis   . Sleep apnea     Past Surgical History:  Procedure Laterality Date  . ADRENALECTOMY Right 02/20/2016 - 03/20/2016  . COLONOSCOPY  12/05/2003, 09/05/2002   Dr. Chauncey Cruel. [place] @ [facility] - Adenomatous Polyps, [facility] (sister)  . JOINT REPLACEMENT Left 02/12/2014   Left knee medial MAKOplasty  . UPP surgery on throat    . VASECTOMY       There were no vitals filed for this visit.  Subjective Assessment - 01/02/19 1723    Subjective  Patient reports he has 2/10 right glute pain (often changes sides) and paresthesia in anterior left leg. He has had a challenging day overall due to water chemistry issues so he is tired. He reports overall he seems to have less pain and "heaviness" in his legs. He reports no excessive soreness or pain following last treatment session. He reports continuing to feel irritaiton on the right side, but not as bad as before.     Pertinent History  Patient is a 53 y.o. male who presents to outpatient physical therapy with a referral for medical diagnosis neurogenic claudication. This patient's chief complaints consist of low back pain, leg pain and paresthesia, difficulty walking, limited activity tolerance leading to the following functional deficits: difficulty with any weight bearing activity, difficulty walking, with ADLs, IADLs, work tasks and usual household and social activities. Relevant past medical history and comorbidities include type 2 diabetes mellitus, primary aldosteronism, history of atrial flutter with ablation, left partial knee arthroplasty, anxiety, depression, sleep apnea, psoriasis, hypertension.     Limitations  Walking;Lifting;House hold activities;Standing;Other (comment)   all weight bearing activities  How long can you sit comfortably?  1- 2 hours    How long can you stand comfortably?  < 15 min    How long can you walk comfortably?  none at times    Diagnostic tests  Imaging: MRI report dated 11/20/2019: "IMPRESSION: 1. No acute osseous abnormality. Mild progression of L4-5 grade 1 anterolisthesis. 2. Progression of lumbar spondylosis greatest at L3-4 and L4-5. 3. Severe L3-4 and L4-5 spinal canal stenosis. Crowding of cauda equina above L3-4 level likely due to high-grade stenosis. 4. Moderate L3-4, moderate L4-5, and mild L5-S1 foraminal stenosis."    Patient Stated Goals  would  like to be able to return to PLOF and valued activities without limitation    Currently in Pain?  Yes    Pain Score  2     Pain Location  Back    Pain Orientation  Posterior;Lower;Right    Pain Descriptors / Indicators  Aching    Pain Type  Chronic pain    Pain Radiating Towards  paresthesia on left shin. and plantar surface of both feet (possibly slightly better).     Pain Onset  More than a month ago      TREATMENT: Therapeutic exercise:to centralize symptoms and improve ROM, strength, muscular endurance, and activity tolerance required for successful completion of functional activities. -  NuStep level 10 using bilateral upper and lower extremities. Seat and hand setting 16. For improved extremity mobility, muscular endurance, and activity tolerance; and to induce the analgesic effect of aerobic exercise, stimulate improved joint nutrition, and prepare body structures and systems for following interventions. x 6  Minutes during subjective exam. - seated thoracic extension in chair with manual overpressure x 20 to improve motion and decrease pain. Cuing for technique.  - quadruped thoracic rotation with hand behind head, then behind small of back,  to improve thoracic mobility and postural strength. x10 each side - foam roller wall slides to stretch thoracic spine and bilateral lats, 3 second hold, x 10. Cuing for technique.   Manual therapy: to reduce pain and tissue tension, improve range of motion, neuromodulation, in order to promote improved ability to complete functional activities. - Prone CPA along lumbar and thoracic spine grade I-IV with and without wedge assist focusing on tender points and sections pt reports relieves pain and tension. - STM with and without foam roller assist over posterior spinal muscles of the lumbar and thoracic spine.   - Hooklying CPA joint mobilization to thoracic spine grade IV+ x 2 with foam roller, x 2 with wedge. Distraction joint mobilization to  thoracic spine grade IV+ x 3. To improve motion and decrease pain.   HOME EXERCISE PROGRAM Access Code: [email or web address]  URL: [email or web address]  Date: 12/26/2018  Prepared by: Rosita Kea   Exercises   Supine Lower Trunk Rotation - 20 reps - 3 seconds hold - 1 Sets - 1-2x daily - 7x weekly   Supine Double Knee to Chest - 20 reps - 3 second hold - 1 Sets - 1-2x daily - 7x weekly   Supine Pelvic Tilt - 20 reps - 1 second hold - 1 Sets - 1-2x daily - 7x weekly   Supine LE Neural Mobilization - 15 reps - 1 second hold - 1 Sets - 1x daily - 7x weekly   Patient response to treatment:  Pt tolerated treatment well. He was able to tolerate manual therapoy with noted improvement in tension and mobility following per pt report. He found the grade IV+ mobilizations to  be a "good stretch" but no cavitations were produced. Pt reported he felt better by end of session. He reported decreased buttocks pain in prone with two pillows under his abdomen/hip region. Pt required multimodal cuing for proper technique and to facilitate improved neuromuscular control, strength, range of motion, and functional ability resulting in improved performance and form.     PT Education - 01/03/19 1609    Education Details  Exercise purpose/form. Self management techniques. Education on diagnosis, prognosis, POC, anatomy and physiology of current condition     Person(s) Educated  Patient    Methods  Explanation;Demonstration;Tactile cues;Verbal cues    Comprehension  Verbalized understanding;Returned demonstration       PT Short Term Goals - 12/24/18 1959      PT SHORT TERM GOAL #1   Title  Be independent with initial home exercise program for self-management of symptoms.    Baseline  Initial HEP to be provided at 2nd visit    Time  2    Period  Weeks    Status  New    Target Date  01/07/19        PT Long Term Goals - 12/24/18 2000      PT LONG TERM GOAL #1   Title  Be independent with  a long-term home exercise program for self-management of symptoms.     Baseline  Initial HEP to be provied at 2nd visit (12/24/2018);    Time  6    Period  Weeks    Status  New    Target Date  02/04/19      PT LONG TERM GOAL #2   Title  Demonstrate improved FOTO score by 10 units to demonstrate improvement in overall condition and self-reported functional ability.     Baseline  FOTO = 45 (12/24/2018);     Time  6    Period  Weeks    Status  New    Target Date  02/04/19      PT LONG TERM GOAL #3   Title  Reduce pain to 3/10 with functional activities to allow patient to complete valued functional tasks such as walking with less difficulty    Baseline  pain up to 8/10 (12/24/2018);     Time  6    Period  Weeks    Status  New    Target Date  02/04/19      PT LONG TERM GOAL #4   Title  Patient will improve standing/walking tolerance to equal or greater than 1 hour to improve ability to perform work duties.     Baseline  0-15 min (12/24/2018);     Time  6    Period  Weeks    Status  New      PT LONG TERM GOAL #5   Title  Complete community, work and/or recreational activities without limitation due to current condition.     Baseline  very limited in all weight bearing activities (see subjective exam, 12/24/2018);     Time  8    Period  Weeks    Status  New    Target Date  02/04/19            Plan - 01/03/19 1614    Clinical Impression Statement   Patient has attended 4 physical therapy sessions so far this episode of care. He had improved comfort with exercises today and is reporting significantly improved sense of wellbeing and function since starting PT.  Patient is a 53 y.o.  male referred to outpatient physical therapy with a medical diagnosis of neurogenic claudication who presents with signs and symptoms consistent with low back pain, bilateral hip and leg pain, and difficulty walking consistent with spinal stenosis. Patient presents with significant pain, stiffness, ROM loss,  poor standing activity tolerance, and sensation impairments that are limiting ability to complete ambulation, standing activities, work activities, transfers, bending, lifting, household responsibilities and social/community participation without difficulty. Patient will benefit from skilled physical therapy intervention to address current body structure impairments and activity limitations to improve function and work towards goals set in current POC in order to return to prior level of function or maximal functional improvement. Patient would also benefit from further medical assessment for spinal cord and cauda equina compression. Patient's referring clinician has been notified of symptoms consistent with cauda equina syndrome    Rehab Potential  Fair    Clinical Impairments Affecting Rehab Potential  (+) motivation, (-) severity and chronicity of condition, comorbidities, MRI findings showing encroachment on cauda equina    PT Frequency  2x / week    PT Duration  6 weeks    PT Treatment/Interventions  ADLs/Self Care Home Management;Aquatic Therapy;Moist Heat;Cryotherapy;Electrical Stimulation;Gait training;Stair training;Functional mobility training;Therapeutic activities;Therapeutic exercise;Balance training;Neuromuscular re-education;Patient/family education;Manual techniques;Passive range of motion;Dry needling;Joint Manipulations;Other (comment)   joint mobilizations grades I-IV   PT Next Visit Plan  continue progressive mobility and strengthening as tolerated.     PT Home Exercise Plan  Medbridge Access Code: [email or web address]     Consulted and Agree with Plan of Care  Patient       Patient will benefit from skilled therapeutic intervention in order to improve the following deficits and impairments:  Abnormal gait, Decreased balance, Decreased endurance, Decreased mobility, Difficulty walking, Hypomobility, Increased muscle spasms, Impaired sensation, Decreased range of motion, Impaired perceived  functional ability, Obesity, Decreased activity tolerance, Impaired flexibility, Pain  Visit Diagnosis: Chronic bilateral low back pain with bilateral sciatica  Paresthesia of skin  Other muscle spasm  Difficulty in walking, not elsewhere classified     Problem List Patient Active Problem List   Diagnosis Date Noted  . Atrial flutter, paroxysmal ([place]) 12/24/2018  . OSA (obstructive sleep apnea) 12/24/2018  . Psoriasis 12/24/2018  . Primary hyperaldosteronism ([place]) 12/24/2018  . Type 2 diabetes mellitus with diabetic polyneuropathy, without long-term current use of insulin ([place]) 12/24/2018  . Hypertension associated with diabetes ([place]) 12/24/2018  . Hyperlipidemia associated with type 2 diabetes mellitus ([place]) 12/24/2018  . Hypersomnia with sleep apnea 12/24/2018  . Lumbar stenosis with neurogenic claudication 08/22/2014  . Lumbar radiculitis 08/22/2014  . Status post left partial knee replacement 07/31/2014  . Low back pain 07/31/2014  . Aftercare following joint replacement 04/17/2014    Nancy Nordmann, PT, DPT 01/03/2019, 4:15 PM  [facility] [address], [place], [address] Phone: [phone]   Fax:  [phone]  Name: Cory Perez MRN: [id number] Date of Birth: [birth year]

## 2019-01-03 ENCOUNTER — Encounter: Payer: Self-pay | Admitting: Physical Therapy

## 2019-01-07 ENCOUNTER — Ambulatory Visit: Payer: PRIVATE HEALTH INSURANCE | Admitting: Physical Therapy

## 2019-01-09 ENCOUNTER — Ambulatory Visit: Payer: PRIVATE HEALTH INSURANCE | Admitting: Physical Therapy

## 2019-01-09 ENCOUNTER — Encounter: Payer: Self-pay | Admitting: Physical Therapy

## 2019-01-09 DIAGNOSIS — R202 Paresthesia of skin: Secondary | ICD-10-CM

## 2019-01-09 DIAGNOSIS — M62838 Other muscle spasm: Secondary | ICD-10-CM

## 2019-01-09 DIAGNOSIS — M5441 Lumbago with sciatica, right side: Principal | ICD-10-CM

## 2019-01-09 DIAGNOSIS — G8929 Other chronic pain: Secondary | ICD-10-CM

## 2019-01-09 DIAGNOSIS — M5442 Lumbago with sciatica, left side: Principal | ICD-10-CM

## 2019-01-09 DIAGNOSIS — R262 Difficulty in walking, not elsewhere classified: Secondary | ICD-10-CM

## 2019-01-09 NOTE — Therapy (Signed)
[facility] [address] [place], [address] Phone: [phone]   Fax:  [phone]  Physical Therapy Treatment  Patient Details  Name: Cory Perez MRN: [id number] Date of Birth: August 03, 1966 Referring Provider (PT): Edwyna Perfect, [place]   Encounter Date: 01/09/2019  PT End of Session - 01/09/19 1744    Visit Number  5    Number of Visits  12    Date for PT Re-Evaluation  02/04/19    Authorization Type  [employer] reporting period from 12/24/2018    Authorization Time Period  Current Cert period: 07/24/8100 - 02/04/2019 (latest PN: IE 12/24/2018)    Authorization - Visit Number  5    Authorization - Number of Visits  12    PT Start Time  1733    PT Stop Time  1815    PT Time Calculation (min)  42 min    Activity Tolerance  Patient tolerated treatment well    Behavior During Therapy  [facility] for tasks assessed/performed       Past Medical History:  Diagnosis Date  . Abnormal results of liver function studies   . Aldosterone excess (Conn syndrome) ([place])   . Anxiety   . Arrhythmia   . Atrial flutter, paroxysmal ([place]) 12/24/2018   Sp ablation 2017  . Depression   . DJD (degenerative joint disease)   . DM2 (diabetes mellitus, type 2) ([place])   . History of chicken pox   . History of syncope   . HLD (hyperlipidemia)   . HTN (hypertension)   . Hx of atrioventricular node ablation   . Hypersomnia with sleep apnea   . Lumbar spinal stenosis   . Other forms of angina pectoris ([place])    Other and unspecified angina pectoris  . Psoriasis   . Seasonal allergies   . Seborrheic dermatitis   . Sleep apnea     Past Surgical History:  Procedure Laterality Date  . ADRENALECTOMY Right 02/20/2016 - 03/20/2016  . COLONOSCOPY  12/05/2003, 09/05/2002   Dr. Chauncey Cruel. [place] @ [place] - Adenomatous Polyps, [facility] (sister)  . JOINT REPLACEMENT Left 02/12/2014   Left knee medial MAKOplasty  . UPP surgery on throat    . VASECTOMY       There were no vitals filed for this visit.  Subjective Assessment - 01/09/19 1739    Subjective  Patient reports he is feeling okay this afternoon, about the same as usually but continues to feel better overall. He reports 2/10 pain in bilateral glutes, mild altered sensation in left anterior leg. He reports some cramping in the left hamstring today that he has been able to 'shake out.'  He re-scheduled his surgery a week later due to a family event he realized was 3 days after his originally scheduled surgery date.     Pertinent History  Patient is a 53 y.o. male who presents to outpatient physical therapy with a referral for medical diagnosis neurogenic claudication. This patient's chief complaints consist of low back pain, leg pain and paresthesia, difficulty walking, limited activity tolerance leading to the following functional deficits: difficulty with any weight bearing activity, difficulty walking, with ADLs, IADLs, work tasks and usual household and social activities. Relevant past medical history and comorbidities include type 2 diabetes mellitus, primary aldosteronism, history of atrial flutter with ablation, left partial knee arthroplasty, anxiety, depression, sleep apnea, psoriasis, hypertension.     Limitations  Walking;Lifting;House hold activities;Standing;Other (comment)   all weight bearing activities  How long can you sit comfortably?  1- 2 hours    How long can you stand comfortably?  < 15 min    How long can you walk comfortably?  none at times    Diagnostic tests  Imaging: MRI report dated 11/20/2019: "IMPRESSION: 1. No acute osseous abnormality. Mild progression of L4-5 grade 1 anterolisthesis. 2. Progression of lumbar spondylosis greatest at L3-4 and L4-5. 3. Severe L3-4 and L4-5 spinal canal stenosis. Crowding of cauda equina above L3-4 level likely due to high-grade stenosis. 4. Moderate L3-4, moderate L4-5, and mild L5-S1 foraminal stenosis."    Patient Stated Goals   would like to be able to return to PLOF and valued activities without limitation    Currently in Pain?  Yes    Pain Score  2     Pain Location  Back    Pain Orientation  Posterior;Lower;Right;Left    Pain Descriptors / Indicators  Aching    Pain Type  Chronic pain    Pain Radiating Towards  paresthesia on left shin and plantar surfaces of both feet    Pain Onset  More than a month ago    Pain Frequency  Constant       TREATMENT: Therapeutic exercise:to centralize symptoms and improve ROM, strength, muscular endurance, and activity tolerance required for successful completion of functional activities. -NuStep level10using bilateral upper and lower extremities. Seat and hand setting16. For improved extremity mobility, muscular endurance, and activity tolerance; and to induce the analgesic effect of aerobic exercise, stimulate improved joint nutrition, and prepare body structures and systems for following interventions. x5.5Minutes during subjective exam. - seated thoracic extension in chair with manual overpressure x 20 to improve motion and decrease pain. Cuing for technique.  - quadruped thoracic rotation with hand behind head, then behind small of back,  to improve thoracic mobility and postural strength. x10 each side - seated lat pull with isolated scapular depression/elevation to improve postural coordination and place slight distraction force on spine. Cuing for proper movement timing. x15 at 25#.  - foam roller wall slides to stretch thoracic spine and bilateral lats, 3 second hold, x 10. Cuing for technique.  Manual therapy:to reduce pain and tissue tension, improve range of motion, neuromodulation, in order to promote improved ability to complete functional activities. - Prone CPA along lumbar and thoracic spine grade I-IV with and without wedge assist focusing on tender points and sections pt reports relieves pain and tension. - STM with and without foam roller assist  over posterior spinal muscles of the lumbar and thoracic spine.   HOME EXERCISE PROGRAM Access Code: [email or web address]  URL: [email or web address]  Date: 12/26/2018  Prepared by: Rosita Kea   Exercises   Supine Lower Trunk Rotation - 20 reps - 3 seconds hold - 1 Sets - 1-2x daily - 7x weekly   Supine Double Knee to Chest - 20 reps - 3 second hold - 1 Sets - 1-2x daily - 7x weekly   Supine Pelvic Tilt - 20 reps - 1 second hold - 1 Sets - 1-2x daily - 7x weekly   Supine LE Neural Mobilization - 15 reps - 1 second hold - 1 Sets - 1x daily - 7x weekly   Patient response to treatment:  Pt tolerated treatmentwell. He was able to tolerate manual therapy with noted improvement in tension and mobility following per pt report. Did not use HVLA thrust due to no improvement in result over lower force methods used at past treatment sessions. He continues to  report decreased buttocks pain in prone with two pillows under his abdomen/hip region.Able to progress to lat pull downs today with good tolerance. Pt required multimodal cuing for proper technique and to facilitate improved neuromuscular control, strength, range of motion, and functional ability resulting in improved performance and form.      PT Education - 01/09/19 1744    Education Details  Exercise purpose/form. Self management techniques. Education on diagnosis, prognosis, POC, anatomy and physiology of current condition     Person(s) Educated  Patient    Methods  Explanation;Demonstration;Tactile cues;Verbal cues    Comprehension  Verbalized understanding;Returned demonstration       PT Short Term Goals - 01/09/19 1825      PT SHORT TERM GOAL #1   Title  Be independent with initial home exercise program for self-management of symptoms.    Baseline  Initial HEP to be provided at 2nd visit    Time  2    Period  Weeks    Status  On-going    Target Date  01/23/19        PT Long Term Goals - 12/24/18 2000       PT LONG TERM GOAL #1   Title  Be independent with a long-term home exercise program for self-management of symptoms.     Baseline  Initial HEP to be provied at 2nd visit (12/24/2018);    Time  6    Period  Weeks    Status  New    Target Date  02/04/19      PT LONG TERM GOAL #2   Title  Demonstrate improved FOTO score by 10 units to demonstrate improvement in overall condition and self-reported functional ability.     Baseline  FOTO = 45 (12/24/2018);     Time  6    Period  Weeks    Status  New    Target Date  02/04/19      PT LONG TERM GOAL #3   Title  Reduce pain to 3/10 with functional activities to allow patient to complete valued functional tasks such as walking with less difficulty    Baseline  pain up to 8/10 (12/24/2018);     Time  6    Period  Weeks    Status  New    Target Date  02/04/19      PT LONG TERM GOAL #4   Title  Patient will improve standing/walking tolerance to equal or greater than 1 hour to improve ability to perform work duties.     Baseline  0-15 min (12/24/2018);     Time  6    Period  Weeks    Status  New      PT LONG TERM GOAL #5   Title  Complete community, work and/or recreational activities without limitation due to current condition.     Baseline  very limited in all weight bearing activities (see subjective exam, 12/24/2018);     Time  8    Period  Weeks    Status  New    Target Date  02/04/19            Plan - 01/09/19 1825    Clinical Impression Statement   Patient demonstrates good attendance this episode of care and is making progress towards goals. He continues to report decreased pain and improved function since starting physical therapy but his impairments and functional limitations continue to hinder him and he has not yet returned to PLOF. Patient is  a 53 y.o. male referred to outpatient physical therapy with a medical diagnosis of neurogenic claudication who presents with signs and symptoms consistent with low back pain, bilateral hip  and leg pain, and difficulty walking consistent with spinal stenosis. Patient presents with significant pain, stiffness, ROM loss, poor standing activity tolerance, and sensation impairments that are limiting ability to complete ambulation, standing activities, work activities, transfers, bending, lifting, household responsibilities and social/community participation without difficulty. Patient will benefit from skilled physical therapy intervention to address current body structure impairments and activity limitations to improve function and work towards goals set in current POC in order to return to prior level of function or maximal functional improvement. Patient would also benefit from further medical assessment for spinal cord and cauda equina compression. Patient's referring clinician has been notified of symptoms consistent with cauda equina syndrome    Rehab Potential  Fair    Clinical Impairments Affecting Rehab Potential  (+) motivation, (-) severity and chronicity of condition, comorbidities, MRI findings showing encroachment on cauda equina    PT Frequency  2x / week    PT Duration  6 weeks    PT Treatment/Interventions  ADLs/Self Care Home Management;Aquatic Therapy;Moist Heat;Cryotherapy;Electrical Stimulation;Gait training;Stair training;Functional mobility training;Therapeutic activities;Therapeutic exercise;Balance training;Neuromuscular re-education;Patient/family education;Manual techniques;Passive range of motion;Dry needling;Joint Manipulations;Other (comment)   joint mobilizations grades I-IV   PT Next Visit Plan  continue progressive mobility and strengthening as tolerated.     PT Home Exercise Plan  Medbridge Access Code: [email or web address]     Consulted and Agree with Plan of Care  Patient       Patient will benefit from skilled therapeutic intervention in order to improve the following deficits and impairments:  Abnormal gait, Decreased balance, Decreased endurance, Decreased mobility,  Difficulty walking, Hypomobility, Increased muscle spasms, Impaired sensation, Decreased range of motion, Impaired perceived functional ability, Obesity, Decreased activity tolerance, Impaired flexibility, Pain  Visit Diagnosis: Chronic bilateral low back pain with bilateral sciatica  Paresthesia of skin  Other muscle spasm  Difficulty in walking, not elsewhere classified     Problem List Patient Active Problem List   Diagnosis Date Noted  . Atrial flutter, paroxysmal ([place]) 12/24/2018  . OSA (obstructive sleep apnea) 12/24/2018  . Psoriasis 12/24/2018  . Primary hyperaldosteronism ([place]) 12/24/2018  . Type 2 diabetes mellitus with diabetic polyneuropathy, without long-term current use of insulin ([place]) 12/24/2018  . Hypertension associated with diabetes ([place]) 12/24/2018  . Hyperlipidemia associated with type 2 diabetes mellitus ([place]) 12/24/2018  . Hypersomnia with sleep apnea 12/24/2018  . Lumbar stenosis with neurogenic claudication 08/22/2014  . Lumbar radiculitis 08/22/2014  . Status post left partial knee replacement 07/31/2014  . Low back pain 07/31/2014  . Aftercare following joint replacement 04/17/2014    Nancy Nordmann, PT, DPT 01/09/2019, 6:26 PM  [facility] [address], [place], [address] Phone: [phone]   Fax:  [phone]  Name: Cory Perez MRN: [id number] Date of Birth: 1966-04-09

## 2019-01-14 ENCOUNTER — Ambulatory Visit: Payer: PRIVATE HEALTH INSURANCE | Admitting: Physical Therapy

## 2019-01-14 DIAGNOSIS — M5442 Lumbago with sciatica, left side: Secondary | ICD-10-CM | POA: Diagnosis not present

## 2019-01-14 DIAGNOSIS — G8929 Other chronic pain: Secondary | ICD-10-CM

## 2019-01-14 DIAGNOSIS — R202 Paresthesia of skin: Secondary | ICD-10-CM

## 2019-01-14 DIAGNOSIS — R262 Difficulty in walking, not elsewhere classified: Secondary | ICD-10-CM

## 2019-01-14 DIAGNOSIS — M5441 Lumbago with sciatica, right side: Principal | ICD-10-CM

## 2019-01-14 DIAGNOSIS — M62838 Other muscle spasm: Secondary | ICD-10-CM

## 2019-01-14 NOTE — Therapy (Signed)
[facility] [address], [place], [address] Phone: [phone]   Fax:  [phone]  Physical Therapy Treatment  Patient Details  Name: Cory Perez MRN: [id number] Date of Birth: September 21, 1966 Referring Provider (PT): Edwyna Perfect, [place]   Encounter Date: 01/14/2019  PT End of Session - 01/14/19 1740    Visit Number  6    Number of Visits  12    Date for PT Re-Evaluation  02/04/19    Authorization Type  [employer] reporting period from 12/24/2018    Authorization Time Period  Current Cert period: 0/05/3709 - 02/04/2019 (latest PN: IE 12/24/2018)    Authorization - Visit Number  6    Authorization - Number of Visits  12    PT Start Time  1730    PT Stop Time  1815    PT Time Calculation (min)  45 min    Activity Tolerance  Patient tolerated treatment well    Behavior During Therapy  [facility] for tasks assessed/performed       Past Medical History:  Diagnosis Date  . Abnormal results of liver function studies   . Aldosterone excess (Conn syndrome) ([place])   . Anxiety   . Arrhythmia   . Atrial flutter, paroxysmal ([place]) 12/24/2018   Sp ablation 2017  . Depression   . DJD (degenerative joint disease)   . DM2 (diabetes mellitus, type 2) ([place])   . History of chicken pox   . History of syncope   . HLD (hyperlipidemia)   . HTN (hypertension)   . Hx of atrioventricular node ablation   . Hypersomnia with sleep apnea   . Lumbar spinal stenosis   . Other forms of angina pectoris ([place])    Other and unspecified angina pectoris  . Psoriasis   . Seasonal allergies   . Seborrheic dermatitis   . Sleep apnea     Past Surgical History:  Procedure Laterality Date  . ADRENALECTOMY Right 02/20/2016 - 03/20/2016  . COLONOSCOPY  12/05/2003, 09/05/2002   Dr. Chauncey Cruel. [place] @ [place] - Adenomatous Polyps, [employer] (sister)  . JOINT REPLACEMENT Left 02/12/2014   Left knee medial MAKOplasty  . UPP surgery on throat    . VASECTOMY       There were no vitals filed for this visit.  Subjective Assessment - 01/14/19 1737    Subjective  Patient reports his pain has been increasing gradually since last treatment session and he thinks the steroid shot is wearing off. He states he has noticed it is harder to get around and he is more aware of his pain overall. He reports saddel paresthesia and urinary symptoms are gradually increasing but not as much as prior to cortisone shot. He reports he felt fine following last treatment session.     Pertinent History  Patient is a 53 y.o. male who presents to outpatient physical therapy with a referral for medical diagnosis neurogenic claudication. This patient's chief complaints consist of low back pain, leg pain and paresthesia, difficulty walking, limited activity tolerance leading to the following functional deficits: difficulty with any weight bearing activity, difficulty walking, with ADLs, IADLs, work tasks and usual household and social activities. Relevant past medical history and comorbidities include type 2 diabetes mellitus, primary aldosteronism, history of atrial flutter with ablation, left partial knee arthroplasty, anxiety, depression, sleep apnea, psoriasis, hypertension.     Limitations  Walking;Lifting;House hold activities;Standing;Other (comment)   all weight bearing activities   How long can  you sit comfortably?  1- 2 hours    How long can you stand comfortably?  < 15 min    How long can you walk comfortably?  none at times    Diagnostic tests  Imaging: MRI report dated 11/20/2019: "IMPRESSION: 1. No acute osseous abnormality. Mild progression of L4-5 grade 1 anterolisthesis. 2. Progression of lumbar spondylosis greatest at L3-4 and L4-5. 3. Severe L3-4 and L4-5 spinal canal stenosis. Crowding of cauda equina above L3-4 level likely due to high-grade stenosis. 4. Moderate L3-4, moderate L4-5, and mild L5-S1 foraminal stenosis."    Patient Stated Goals  would like to be able to  return to PLOF and valued activities without limitation    Currently in Pain?  Yes    Pain Score  4     Pain Location  Back    Pain Orientation  Posterior;Right;Left;Lower    Pain Descriptors / Indicators  Aching    Pain Type  Chronic pain    Pain Radiating Towards  paresthesia on left shin and plantar surfaces of both feet    Pain Onset  More than a month ago    Pain Frequency  Constant      TREATMENT: Therapeutic exercise:to centralize symptoms and improve ROM, strength, muscular endurance, and activity tolerance required for successful completion of functional activities. -NuStep level10using bilateral upper and lower extremities. Seat and hand setting16. For improved extremity mobility, muscular endurance, and activity tolerance; and to induce the analgesic effect of aerobic exercise, stimulate improved joint nutrition, and prepare body structures and systems for following interventions. x5Minutes during subjective exam. - seated lumbar flexion ball roll out with large blue theraball to stretch low back into flexion. 3.5 min forwards, x2 min alternating diagonals. Pt completed the first few with knees extended to stretch nerves pt reports feels good at first, but then reports increased burring posterior bilateral legs so bent knees to decrease nerve stretch.  - foam roller wall slides to stretch thoracic spine and bilateral lats, 3 second hold, x 20. Cuing for technique. - hooklying LTR x 10 each side to decrease lumbar tension and pain. Cuing for technique.  - hooklying sciatic nerve glide with towel behind leg to help support it, x 10 each side. Cuing for form. Difficulty due to cramping sensation at hip, calf, and anterior thigh.   Manual therapy:to reduce pain and tissue tension, improve range of motion, neuromodulation, in order to promote improved ability to complete functional activities. - Prone CPA along lumbar and thoracic spine grade I-IV with and without wedge  assist focusing on tender points and sections pt reports relieves pain and tension. - STM with and without foam roller assist over posterior spinal muscles of the lumbar and thoracic spine.   HOME EXERCISE PROGRAM Access Code: [email or web address]  URL: [email or web address]  Date: 01/14/2019  Prepared by: Rosita Kea   Exercises  Supine Lower Trunk Rotation - 10-15 reps - 1 second hold - 3 Sets - 1x daily - 3x weekly  Supine LE Neural Mobilization - 15 reps - 1 second hold - 1 Sets - 1x daily - 7x weekly  Quadruped Thoracic Rotation with Hand on Neck - 10-15 reps - 1 second hold - 2 Sets - 1x daily - 7x weekly  Quadruped Thoracic Rotation with Hand on Low Back - 10-15 reps - 1 second hold - 2 Sets - 1x daily - 7x weekly     PT Education - 01/14/19 1740    Education Details  Exercise purpose/form. Self management  techniques. Education on diagnosis, prognosis, POC, anatomy and physiology of current condition     Person(s) Educated  Patient    Methods  Explanation;Demonstration;Tactile cues;Verbal cues    Comprehension  Verbalized understanding;Returned demonstration       PT Short Term Goals - 01/09/19 1825      PT SHORT TERM GOAL #1   Title  Be independent with initial home exercise program for self-management of symptoms.    Baseline  Initial HEP to be provided at 2nd visit    Time  2    Period  Weeks    Status  On-going    Target Date  01/23/19        PT Long Term Goals - 12/24/18 2000      PT LONG TERM GOAL #1   Title  Be independent with a long-term home exercise program for self-management of symptoms.     Baseline  Initial HEP to be provied at 2nd visit (12/24/2018);    Time  6    Period  Weeks    Status  New    Target Date  02/04/19      PT LONG TERM GOAL #2   Title  Demonstrate improved FOTO score by 10 units to demonstrate improvement in overall condition and self-reported functional ability.     Baseline  FOTO = 45 (12/24/2018);     Time  6    Period   Weeks    Status  New    Target Date  02/04/19      PT LONG TERM GOAL #3   Title  Reduce pain to 3/10 with functional activities to allow patient to complete valued functional tasks such as walking with less difficulty    Baseline  pain up to 8/10 (12/24/2018);     Time  6    Period  Weeks    Status  New    Target Date  02/04/19      PT LONG TERM GOAL #4   Title  Patient will improve standing/walking tolerance to equal or greater than 1 hour to improve ability to perform work duties.     Baseline  0-15 min (12/24/2018);     Time  6    Period  Weeks    Status  New      PT LONG TERM GOAL #5   Title  Complete community, work and/or recreational activities without limitation due to current condition.     Baseline  very limited in all weight bearing activities (see subjective exam, 12/24/2018);     Time  8    Period  Weeks    Status  New    Target Date  02/04/19            Plan - 01/14/19 1926    Clinical Impression Statement  Pt tolerated treatmentwell.He was able to tolerate manual therapy with mild  improvement in tension and mobility following per pt report. Patient overall more sore today with less significant improvement with therapy that seems to be related to benefits of recent steroid injection diminishing with time. He continues to report decreased buttocks pain in prone with two pillows under his abdomen/hip region.Focused more on flexion based exercises today to attempt to improve comfort. Pt required multimodal cuing for proper technique and to facilitate improved neuromuscular control, strength, range of motion, and functional ability resulting in improved performance and form.Patient will benefit from skilled physical therapy intervention to address current body structure impairments and activity limitations to improve  function and work towards goals set in current POC in order to return to prior level of function or maximal functional improvement. Patient would also  benefit from continued medical assessment and monitoring for spinal cord and cauda equina compression. Patient's referring clinician has been notified of symptoms consistent with cauda equina syndrome    Rehab Potential  Fair    Clinical Impairments Affecting Rehab Potential  (+) motivation, (-) severity and chronicity of condition, comorbidities, MRI findings showing encroachment on cauda equina    PT Frequency  2x / week    PT Duration  6 weeks    PT Treatment/Interventions  ADLs/Self Care Home Management;Aquatic Therapy;Moist Heat;Cryotherapy;Electrical Stimulation;Gait training;Stair training;Functional mobility training;Therapeutic activities;Therapeutic exercise;Balance training;Neuromuscular re-education;Patient/family education;Manual techniques;Passive range of motion;Dry needling;Joint Manipulations;Other (comment)   joint mobilizations grades I-IV   PT Next Visit Plan  continue progressive mobility and strengthening as tolerated.     PT Home Exercise Plan  Medbridge Access Code: [email or web address]     Consulted and Agree with Plan of Care  Patient       Patient will benefit from skilled therapeutic intervention in order to improve the following deficits and impairments:  Abnormal gait, Decreased balance, Decreased endurance, Decreased mobility, Difficulty walking, Hypomobility, Increased muscle spasms, Impaired sensation, Decreased range of motion, Impaired perceived functional ability, Obesity, Decreased activity tolerance, Impaired flexibility, Pain  Visit Diagnosis: Chronic bilateral low back pain with bilateral sciatica  Paresthesia of skin  Other muscle spasm  Difficulty in walking, not elsewhere classified     Problem List Patient Active Problem List   Diagnosis Date Noted  . Atrial flutter, paroxysmal ([place]) 12/24/2018  . OSA (obstructive sleep apnea) 12/24/2018  . Psoriasis 12/24/2018  . Primary hyperaldosteronism ([place]) 12/24/2018  . Type 2 diabetes mellitus with diabetic  polyneuropathy, without long-term current use of insulin ([place]) 12/24/2018  . Hypertension associated with diabetes ([place]) 12/24/2018  . Hyperlipidemia associated with type 2 diabetes mellitus ([place]) 12/24/2018  . Hypersomnia with sleep apnea 12/24/2018  . Lumbar stenosis with neurogenic claudication 08/22/2014  . Lumbar radiculitis 08/22/2014  . Status post left partial knee replacement 07/31/2014  . Low back pain 07/31/2014  . Aftercare following joint replacement 04/17/2014    Nancy Nordmann, PT, DPT 01/14/2019, 7:27 PM  [facility] [address], [place], [address] Phone: [phone]   Fax:  [phone]  Name: Cory Perez MRN: [id number] Date of Birth: Apr 16, 1966

## 2019-01-16 ENCOUNTER — Encounter: Payer: Self-pay | Admitting: Physical Therapy

## 2019-01-16 ENCOUNTER — Ambulatory Visit: Payer: PRIVATE HEALTH INSURANCE | Admitting: Physical Therapy

## 2019-01-16 ENCOUNTER — Other Ambulatory Visit: Payer: Self-pay

## 2019-01-16 DIAGNOSIS — R202 Paresthesia of skin: Secondary | ICD-10-CM

## 2019-01-16 DIAGNOSIS — G8929 Other chronic pain: Secondary | ICD-10-CM

## 2019-01-16 DIAGNOSIS — M5442 Lumbago with sciatica, left side: Secondary | ICD-10-CM | POA: Diagnosis not present

## 2019-01-16 DIAGNOSIS — R262 Difficulty in walking, not elsewhere classified: Secondary | ICD-10-CM

## 2019-01-16 DIAGNOSIS — M62838 Other muscle spasm: Secondary | ICD-10-CM

## 2019-01-16 DIAGNOSIS — M5441 Lumbago with sciatica, right side: Principal | ICD-10-CM

## 2019-01-16 NOTE — Therapy (Signed)
[facility] [address], [place], [address] Phone: [phone]   Fax:  [phone]  Physical Therapy Treatment  Patient Details  Name: Cory Perez MRN: [id number] Date of Birth: 1965/11/30 Referring Provider (PT): Edwyna Perfect, [place]   Encounter Date: 01/16/2019  PT End of Session - 01/16/19 1728    Visit Number  7    Number of Visits  12    Date for PT Re-Evaluation  02/04/19    Authorization Type  [employer] reporting period from 12/24/2018    Authorization Time Period  Current Cert period: 01/27/7563 - 02/04/2019 (latest PN: IE 12/24/2018)    Authorization - Visit Number  7    Authorization - Number of Visits  12    PT Start Time  3329    PT Stop Time  1810    PT Time Calculation (min)  45 min    Activity Tolerance  Patient tolerated treatment well    Behavior During Therapy  [facility] for tasks assessed/performed       Past Medical History:  Diagnosis Date  . Abnormal results of liver function studies   . Aldosterone excess (Conn syndrome) ([place])   . Anxiety   . Arrhythmia   . Atrial flutter, paroxysmal ([place]) 12/24/2018   Sp ablation 2017  . Depression   . DJD (degenerative joint disease)   . DM2 (diabetes mellitus, type 2) ([place])   . History of chicken pox   . History of syncope   . HLD (hyperlipidemia)   . HTN (hypertension)   . Hx of atrioventricular node ablation   . Hypersomnia with sleep apnea   . Lumbar spinal stenosis   . Other forms of angina pectoris ([place])    Other and unspecified angina pectoris  . Psoriasis   . Seasonal allergies   . Seborrheic dermatitis   . Sleep apnea     Past Surgical History:  Procedure Laterality Date  . ADRENALECTOMY Right 02/20/2016 - 03/20/2016  . COLONOSCOPY  12/05/2003, 09/05/2002   Dr. Chauncey Cruel. [place] @ [place] - Adenomatous Polyps, [facility] (sister)  . JOINT REPLACEMENT Left 02/12/2014   Left knee medial MAKOplasty  . UPP surgery on throat    . VASECTOMY       There were no vitals filed for this visit.  Subjective Assessment - 01/16/19 1726    Subjective  Patient reports he is tired from pulling around fire hoses today and cleaning out a settling basin. He actually feels pretty good and says that sometime more activity like this leads him to feeling better but sometimes worse. Today he feels pretty good. He felt okay after last treatment session. He is not doing his HEP program.     Pertinent History  Patient is a 53 y.o. male who presents to outpatient physical therapy with a referral for medical diagnosis neurogenic claudication. This patient's chief complaints consist of low back pain, leg pain and paresthesia, difficulty walking, limited activity tolerance leading to the following functional deficits: difficulty with any weight bearing activity, difficulty walking, with ADLs, IADLs, work tasks and usual household and social activities. Relevant past medical history and comorbidities include type 2 diabetes mellitus, primary aldosteronism, history of atrial flutter with ablation, left partial knee arthroplasty, anxiety, depression, sleep apnea, psoriasis, hypertension.     Limitations  Walking;Lifting;House hold activities;Standing;Other (comment)   all weight bearing activities   How long can you sit comfortably?  1- 2 hours    How long  can you stand comfortably?  < 15 min    How long can you walk comfortably?  none at times    Diagnostic tests  Imaging: MRI report dated 11/20/2019: "IMPRESSION: 1. No acute osseous abnormality. Mild progression of L4-5 grade 1 anterolisthesis. 2. Progression of lumbar spondylosis greatest at L3-4 and L4-5. 3. Severe L3-4 and L4-5 spinal canal stenosis. Crowding of cauda equina above L3-4 level likely due to high-grade stenosis. 4. Moderate L3-4, moderate L4-5, and mild L5-S1 foraminal stenosis."    Patient Stated Goals  would like to be able to return to PLOF and valued activities without limitation    Currently in  Pain?  Yes    Pain Score  1     Pain Location  Back    Pain Orientation  Posterior;Left;Lower;Right    Pain Descriptors / Indicators  Aching    Pain Type  Chronic pain    Pain Onset  More than a month ago         TREATMENT: Therapeutic exercise:to centralize symptoms and improve ROM, strength, muscular endurance, and activity tolerance required for successful completion of functional activities. -NuStep level10using bilateral upper and lower extremities. Seat and hand setting16. For improved extremity mobility, muscular endurance, and activity tolerance; and to induce the analgesic effect of aerobic exercise, stimulate improved joint nutrition, and prepare body structures and systems for following interventions. x5Minutes during subjective exam. - seated lumbar flexion ball roll out with large blue theraball to stretch low back into flexion. 2 min forwards, x2 min alternating diagonals. Pt completed the first few with knees extended to stretch nerves pt reports feels good at first, but then reports increased burring posterior bilateral legs so bent knees to decrease nerve stretch.  - foam roller wall slides to stretch thoracic spine and bilateral lats, 3 second hold, x2 min. Cuing for technique. - Standing pallof press (multifidus press) with double black band x20 each side. Cuing for trunk stability. Attempted with rotation to the right but caused cramping in right lumbar region so returned to isometric - hooklying LTR x 10 each side to decrease lumbar tension and pain. Cuing for technique.  - hooklying pelvic posterior and anterior pelvic tilt x 10 to improve AROM and decrease muscular tension.  - hooklying articulated bridges with posterior pelvic tilt, to increase hip and trunk strength. 2x10 cuing for form.  - hooklying sciatic nerve glide with towel behind leg to help support it, x 10 each side. Cuing for form.    Manual therapy:to reduce pain and tissue tension, improve  range of motion, neuromodulation, in order to promote improved ability to complete functional activities. - Prone CPA along lumbar and thoracic spine grade I-IV with and without wedge assist focusing on tender points and sections pt reports relieves pain and tension. - STM with and without foam roller assist over posterior spinal muscles of the lumbar and thoracic spine.   HOME EXERCISE PROGRAM Access Code: [email or web address]  URL: [email or web address]  Date: 01/14/2019  Prepared by: Rosita Kea   Exercises   Supine Lower Trunk Rotation - 10-15 reps - 1 second hold - 3 Sets - 1x daily - 3x weekly   Supine LE Neural Mobilization - 15 reps - 1 second hold - 1 Sets - 1x daily - 7x weekly   Quadruped Thoracic Rotation with Hand on Neck - 10-15 reps - 1 second hold - 2 Sets - 1x daily - 7x weekly   Quadruped Thoracic Rotation with Hand on Low Back - 10-15 reps -  1 second hold - 2 Sets - 1x daily - 7x weekly      PT Education - 01/16/19 1728    Education Details  Exercise purpose/form. Self management techniques. Education on diagnosis, prognosis, POC, anatomy and physiology of current condition     Person(s) Educated  Patient    Methods  Explanation;Demonstration;Tactile cues;Verbal cues    Comprehension  Verbalized understanding;Returned demonstration       PT Short Term Goals - 01/09/19 1825      PT SHORT TERM GOAL #1   Title  Be independent with initial home exercise program for self-management of symptoms.    Baseline  Initial HEP to be provided at 2nd visit    Time  2    Period  Weeks    Status  On-going    Target Date  01/23/19        PT Long Term Goals - 12/24/18 2000      PT LONG TERM GOAL #1   Title  Be independent with a long-term home exercise program for self-management of symptoms.     Baseline  Initial HEP to be provied at 2nd visit (12/24/2018);    Time  6    Period  Weeks    Status  New    Target Date  02/04/19      PT LONG TERM GOAL #2    Title  Demonstrate improved FOTO score by 10 units to demonstrate improvement in overall condition and self-reported functional ability.     Baseline  FOTO = 45 (12/24/2018);     Time  6    Period  Weeks    Status  New    Target Date  02/04/19      PT LONG TERM GOAL #3   Title  Reduce pain to 3/10 with functional activities to allow patient to complete valued functional tasks such as walking with less difficulty    Baseline  pain up to 8/10 (12/24/2018);     Time  6    Period  Weeks    Status  New    Target Date  02/04/19      PT LONG TERM GOAL #4   Title  Patient will improve standing/walking tolerance to equal or greater than 1 hour to improve ability to perform work duties.     Baseline  0-15 min (12/24/2018);     Time  6    Period  Weeks    Status  New      PT LONG TERM GOAL #5   Title  Complete community, work and/or recreational activities without limitation due to current condition.     Baseline  very limited in all weight bearing activities (see subjective exam, 12/24/2018);     Time  8    Period  Weeks    Status  New    Target Date  02/04/19            Plan - 01/16/19 1814    Clinical Impression Statement  Pt tolerated treatmentwell.He was able to tolerate manual therapy with mild improvement in tension and mobility following per pt report. He was able to progress strengthening exercises with some report of muscle fatigue in abdominal obliques and lateral hips. Educated about possible DOMS and appropriate response.  Patient overall less sore today than last treatment session. He had mild difficulty during exercises due to a right sided cramp in the lumbar region during trial of pallof press with right rotation and left hamstring cramp with  bridges. He continues to report decreased buttocks pain in prone with two pillows under his abdomen/hip region.Pt required multimodal cuing for proper technique and to facilitate improved neuromuscular control, strength, range of motion,  and functional ability resulting in improved performance and form.Patient will benefit from skilled physical therapy intervention to address current body structure impairments and activity limitations to improve function and work towards goals set in current POC in order to return to prior level of function or maximal functional improvement. Patient would also benefit from continued medical assessment and monitoring for spinal cord and cauda equina compression. Patient's referring clinician has been notified of symptoms consistent with cauda equina syndrome    Rehab Potential  Fair    Clinical Impairments Affecting Rehab Potential  (+) motivation, (-) severity and chronicity of condition, comorbidities, MRI findings showing encroachment on cauda equina    PT Frequency  2x / week    PT Duration  6 weeks    PT Treatment/Interventions  ADLs/Self Care Home Management;Aquatic Therapy;Moist Heat;Cryotherapy;Electrical Stimulation;Gait training;Stair training;Functional mobility training;Therapeutic activities;Therapeutic exercise;Balance training;Neuromuscular re-education;Patient/family education;Manual techniques;Passive range of motion;Dry needling;Joint Manipulations;Other (comment)   joint mobilizations grades I-IV   PT Next Visit Plan  continue progressive mobility and strengthening as tolerated.     PT Home Exercise Plan  Medbridge Access Code: [email or web address]     Consulted and Agree with Plan of Care  Patient       Patient will benefit from skilled therapeutic intervention in order to improve the following deficits and impairments:  Abnormal gait, Decreased balance, Decreased endurance, Decreased mobility, Difficulty walking, Hypomobility, Increased muscle spasms, Impaired sensation, Decreased range of motion, Impaired perceived functional ability, Obesity, Decreased activity tolerance, Impaired flexibility, Pain  Visit Diagnosis: Chronic bilateral low back pain with bilateral sciatica  Paresthesia of  skin  Other muscle spasm  Difficulty in walking, not elsewhere classified     Problem List Patient Active Problem List   Diagnosis Date Noted  . Atrial flutter, paroxysmal ([place]) 12/24/2018  . OSA (obstructive sleep apnea) 12/24/2018  . Psoriasis 12/24/2018  . Primary hyperaldosteronism ([place]) 12/24/2018  . Type 2 diabetes mellitus with diabetic polyneuropathy, without long-term current use of insulin ([place]) 12/24/2018  . Hypertension associated with diabetes ([place]) 12/24/2018  . Hyperlipidemia associated with type 2 diabetes mellitus ([place]) 12/24/2018  . Hypersomnia with sleep apnea 12/24/2018  . Lumbar stenosis with neurogenic claudication 08/22/2014  . Lumbar radiculitis 08/22/2014  . Status post left partial knee replacement 07/31/2014  . Low back pain 07/31/2014  . Aftercare following joint replacement 04/17/2014    Nancy Nordmann, PT, DPT 01/16/2019, 6:15 PM  [facility] [address], [place], [address] Phone: [phone]   Fax:  [phone]  Name: Cory Perez MRN: [id number] Date of Birth: Jul 30, 1966

## 2019-01-21 ENCOUNTER — Ambulatory Visit: Payer: PRIVATE HEALTH INSURANCE | Admitting: Physical Therapy

## 2019-01-23 ENCOUNTER — Encounter: Payer: Self-pay | Admitting: Physical Therapy

## 2019-01-23 ENCOUNTER — Ambulatory Visit: Payer: PRIVATE HEALTH INSURANCE | Attending: Student | Admitting: Physical Therapy

## 2019-01-23 DIAGNOSIS — M62838 Other muscle spasm: Secondary | ICD-10-CM | POA: Diagnosis present

## 2019-01-23 DIAGNOSIS — M5441 Lumbago with sciatica, right side: Secondary | ICD-10-CM | POA: Insufficient documentation

## 2019-01-23 DIAGNOSIS — G8929 Other chronic pain: Secondary | ICD-10-CM | POA: Diagnosis present

## 2019-01-23 DIAGNOSIS — M5442 Lumbago with sciatica, left side: Secondary | ICD-10-CM | POA: Diagnosis present

## 2019-01-23 DIAGNOSIS — R202 Paresthesia of skin: Secondary | ICD-10-CM | POA: Diagnosis present

## 2019-01-23 DIAGNOSIS — R262 Difficulty in walking, not elsewhere classified: Secondary | ICD-10-CM | POA: Diagnosis present

## 2019-01-23 NOTE — Therapy (Signed)
[facility] [address], [place], [address] Phone: [phone]   Fax:  [phone]  Physical Therapy Treatment  Patient Details  Name: Cory Perez MRN: [id number] Date of Birth: 1966-03-09 Referring Provider (PT): Edwyna Perfect, [place]   Encounter Date: 01/23/2019  PT End of Session - 01/23/19 1746    Visit Number  8    Number of Visits  12    Date for PT Re-Evaluation  02/04/19    Authorization Type  [employer] reporting period from 12/24/2018    Authorization Time Period  Current Cert period: 06/25/1659 - 02/04/2019 (latest PN: IE 12/24/2018)    Authorization - Visit Number  8    Authorization - Number of Visits  12    PT Start Time  1735    PT Stop Time  1815    PT Time Calculation (min)  40 min    Activity Tolerance  Patient tolerated treatment well    Behavior During Therapy  [facility] for tasks assessed/performed       Past Medical History:  Diagnosis Date  . Abnormal results of liver function studies   . Aldosterone excess (Conn syndrome) ([place])   . Anxiety   . Arrhythmia   . Atrial flutter, paroxysmal ([place]) 12/24/2018   Sp ablation 2017  . Depression   . DJD (degenerative joint disease)   . DM2 (diabetes mellitus, type 2) ([place])   . History of chicken pox   . History of syncope   . HLD (hyperlipidemia)   . HTN (hypertension)   . Hx of atrioventricular node ablation   . Hypersomnia with sleep apnea   . Lumbar spinal stenosis   . Other forms of angina pectoris ([place])    Other and unspecified angina pectoris  . Psoriasis   . Seasonal allergies   . Seborrheic dermatitis   . Sleep apnea     Past Surgical History:  Procedure Laterality Date  . ADRENALECTOMY Right 02/20/2016 - 03/20/2016  . COLONOSCOPY  12/05/2003, 09/05/2002   Dr. Chauncey Cruel. [place] @ [place] - Adenomatous Polyps, [facility] (sister)  . JOINT REPLACEMENT Left 02/12/2014   Left knee medial MAKOplasty  . UPP surgery on throat    . VASECTOMY       There were no vitals filed for this visit.  Subjective Assessment - 01/23/19 1739    Subjective  Patient reports he has 4/10 pain in the right glute and low back region. States he was sore in his abdomen after last sessions from apparent DOMS but it was tolerable to him. He reports his pain is staying about the same as prior to the steroid injection.     Pertinent History  Patient is a 53 y.o. male who presents to outpatient physical therapy with a referral for medical diagnosis neurogenic claudication. This patient's chief complaints consist of low back pain, leg pain and paresthesia, difficulty walking, limited activity tolerance leading to the following functional deficits: difficulty with any weight bearing activity, difficulty walking, with ADLs, IADLs, work tasks and usual household and social activities. Relevant past medical history and comorbidities include type 2 diabetes mellitus, primary aldosteronism, history of atrial flutter with ablation, left partial knee arthroplasty, anxiety, depression, sleep apnea, psoriasis, hypertension.     Limitations  Walking;Lifting;House hold activities;Standing;Other (comment)   all weight bearing activities   How long can you sit comfortably?  1- 2 hours    How long can you stand comfortably?  < 15 min  How long can you walk comfortably?  none at times    Diagnostic tests  Imaging: MRI report dated 11/20/2019: "IMPRESSION: 1. No acute osseous abnormality. Mild progression of L4-5 grade 1 anterolisthesis. 2. Progression of lumbar spondylosis greatest at L3-4 and L4-5. 3. Severe L3-4 and L4-5 spinal canal stenosis. Crowding of cauda equina above L3-4 level likely due to high-grade stenosis. 4. Moderate L3-4, moderate L4-5, and mild L5-S1 foraminal stenosis."    Patient Stated Goals  would like to be able to return to PLOF and valued activities without limitation    Currently in Pain?  Yes    Pain Score  4     Pain Location  Back    Pain Orientation   Right    Pain Type  Chronic pain    Pain Onset  More than a month ago        TREATMENT: Therapeutic exercise:to centralize symptoms and improve ROM, strength, muscular endurance, and activity tolerance required for successful completion of functional activities. -NuStep level10using bilateral upper and lower extremities. Seat and hand setting16. For improved extremity mobility, muscular endurance, and activity tolerance; and to induce the analgesic effect of aerobic exercise, stimulate improved joint nutrition, and prepare body structures and systems for following interventions. x5Minutes during subjective exam. - seated lumbar flexion ball roll out with large blue theraball to stretch low back into flexion. 2 min forwards, x2 min alternating diagonals. Pt completed the first few with knees extended to stretch nerves pt reports feels good at first, but then reports increased burring posterior bilateral legs so bent knees to decrease nerve stretch. - foam roller wall slides to stretch thoracic spine and bilateral lats, 3 second hold, x2 min. Cuing for technique. - Standing pallof press (multifidus press) with double black band x20 each side. Cuing for trunk stability. To improve trunk strength.  - hooklying pelvic posterior and anterior pelvic tilt x 10 to improve AROM and decrease muscular tension.  - hooklying articulated bridges with posterior pelvic tilt, to increase hip and trunk strength. 2x10 cuing for form.  - hooklying LTR x 10 each side to decrease lumbar tension and pain. Cuing for technique.  - hooklying sciatic nerve glide with towel behind leg to help support it, x 10 each side. Cuing for form.   Manual therapy:to reduce pain and tissue tension, improve range of motion, neuromodulation, in order to promote improved ability to complete functional activities. - Prone CPA along lumbar and thoracic spine grade I-IV with and without wedge assist focusing on tender points  and sections pt reports relieves pain and tension. - STM with and without foam roller assist over posterior spinal muscles of the lumbar and thoracic spine.   HOME EXERCISE PROGRAM Access Code: [email or web address]  URL: [email or web address]  Date: 01/23/2019  Prepared by: Rosita Kea   Exercises  Supine Lower Trunk Rotation - 10-15 reps - 1 second hold - 3 Sets - 1x daily - 3x weekly  Supine Bridge with Spinal Articulation - 2 sets - 10 reps - 1-5 seconds hold  Supine LE Neural Mobilization - 15 reps - 1 second hold - 1 Sets - 1x daily - 7x weekly  Quadruped Thoracic Rotation with Hand on Neck - 10-15 reps - 1 second hold - 2 Sets - 1x daily - 7x weekly    PT Education - 01/23/19 1746    Education Details  Exercise purpose/form. Self management techniques. Education on diagnosis, prognosis, POC, anatomy and physiology of current condition     Person(s)  Educated  Patient    Methods  Explanation;Demonstration;Tactile cues;Verbal cues    Comprehension  Verbalized understanding;Returned demonstration       PT Short Term Goals - 01/09/19 1825      PT SHORT TERM GOAL #1   Title  Be independent with initial home exercise program for self-management of symptoms.    Baseline  Initial HEP to be provided at 2nd visit    Time  2    Period  Weeks    Status  On-going    Target Date  01/23/19        PT Long Term Goals - 12/24/18 2000      PT LONG TERM GOAL #1   Title  Be independent with a long-term home exercise program for self-management of symptoms.     Baseline  Initial HEP to be provied at 2nd visit (12/24/2018);    Time  6    Period  Weeks    Status  New    Target Date  02/04/19      PT LONG TERM GOAL #2   Title  Demonstrate improved FOTO score by 10 units to demonstrate improvement in overall condition and self-reported functional ability.     Baseline  FOTO = 45 (12/24/2018);     Time  6    Period  Weeks    Status  New    Target Date  02/04/19      PT LONG TERM  GOAL #3   Title  Reduce pain to 3/10 with functional activities to allow patient to complete valued functional tasks such as walking with less difficulty    Baseline  pain up to 8/10 (12/24/2018);     Time  6    Period  Weeks    Status  New    Target Date  02/04/19      PT LONG TERM GOAL #4   Title  Patient will improve standing/walking tolerance to equal or greater than 1 hour to improve ability to perform work duties.     Baseline  0-15 min (12/24/2018);     Time  6    Period  Weeks    Status  New      PT LONG TERM GOAL #5   Title  Complete community, work and/or recreational activities without limitation due to current condition.     Baseline  very limited in all weight bearing activities (see subjective exam, 12/24/2018);     Time  8    Period  Weeks    Status  New    Target Date  02/04/19            Plan - 01/23/19 1829    Clinical Impression Statement  Pt tolerated treatmentwell.He reports improved feeling of wellbeing following treatment session and states this is the only real physical activity he is getting at this point. He had mild production of right glute pain during bridges but felt more relaxed following manual therapy.  Did not progress exercises due to soreness following last treatment session. He shows improved ROM during nerve glides. He continues to report decreased buttocks pain in prone with two pillows under his abdomen/hip region.Pt required multimodal cuing for proper technique and to facilitate improved neuromuscular control, strength, range of motion, and functional ability resulting in improved performance and form.Patient will benefit from skilled physical therapy intervention to address current body structure impairments and activity limitations to improve function and work towards goals set in current POC in order to return  to prior level of function or maximal functional improvement. Patient would also benefit from continued medical assessment and  monitoring for spinal cord and cauda equina compression. Patient's referring clinician has been notified of symptoms consistent with cauda equina syndrome    Rehab Potential  Fair    Clinical Impairments Affecting Rehab Potential  (+) motivation, (-) severity and chronicity of condition, comorbidities, MRI findings showing encroachment on cauda equina    PT Frequency  2x / week    PT Duration  6 weeks    PT Treatment/Interventions  ADLs/Self Care Home Management;Aquatic Therapy;Moist Heat;Cryotherapy;Electrical Stimulation;Gait training;Stair training;Functional mobility training;Therapeutic activities;Therapeutic exercise;Balance training;Neuromuscular re-education;Patient/family education;Manual techniques;Passive range of motion;Dry needling;Joint Manipulations;Other (comment)   joint mobilizations grades I-IV   PT Next Visit Plan  continue progressive mobility and strengthening as tolerated.     PT Home Exercise Plan  Medbridge Access Code: [email or web address]     Consulted and Agree with Plan of Care  Patient       Patient will benefit from skilled therapeutic intervention in order to improve the following deficits and impairments:  Abnormal gait, Decreased balance, Decreased endurance, Decreased mobility, Difficulty walking, Hypomobility, Increased muscle spasms, Impaired sensation, Decreased range of motion, Impaired perceived functional ability, Obesity, Decreased activity tolerance, Impaired flexibility, Pain  Visit Diagnosis: Chronic bilateral low back pain with bilateral sciatica  Paresthesia of skin  Other muscle spasm  Difficulty in walking, not elsewhere classified     Problem List Patient Active Problem List   Diagnosis Date Noted  . Atrial flutter, paroxysmal ([place]) 12/24/2018  . OSA (obstructive sleep apnea) 12/24/2018  . Psoriasis 12/24/2018  . Primary hyperaldosteronism ([place]) 12/24/2018  . Type 2 diabetes mellitus with diabetic polyneuropathy, without long-term current use of  insulin ([place]) 12/24/2018  . Hypertension associated with diabetes ([place]) 12/24/2018  . Hyperlipidemia associated with type 2 diabetes mellitus ([place]) 12/24/2018  . Hypersomnia with sleep apnea 12/24/2018  . Lumbar stenosis with neurogenic claudication 08/22/2014  . Lumbar radiculitis 08/22/2014  . Status post left partial knee replacement 07/31/2014  . Low back pain 07/31/2014  . Aftercare following joint replacement 04/17/2014    Nancy Nordmann, PT, DPT 01/23/2019, 6:29 PM  [facility] [address], [place], [address] Phone: [phone]   Fax:  [phone]  Name: KYCE GING MRN: [id number] Date of Birth: 12/03/1965

## 2019-01-24 ENCOUNTER — Other Ambulatory Visit: Payer: Self-pay

## 2019-01-24 ENCOUNTER — Encounter
Admission: RE | Admit: 2019-01-24 | Discharge: 2019-01-24 | Disposition: A | Discharge status: Home / Self Care | Payer: PRIVATE HEALTH INSURANCE | Source: Ambulatory Visit | Attending: Neurosurgery | Admitting: Neurosurgery

## 2019-01-24 DIAGNOSIS — I10 Essential (primary) hypertension: Secondary | ICD-10-CM | POA: Insufficient documentation

## 2019-01-24 DIAGNOSIS — Z01818 Encounter for other preprocedural examination: Secondary | ICD-10-CM | POA: Insufficient documentation

## 2019-01-24 DIAGNOSIS — R001 Bradycardia, unspecified: Secondary | ICD-10-CM | POA: Diagnosis not present

## 2019-01-24 LAB — CBC
HCT: 47.6 % (ref 39.0–52.0)
Hemoglobin: 16.5 g/dL (ref 13.0–17.0)
MCH: 31.5 pg (ref 26.0–34.0)
MCHC: 34.7 g/dL (ref 30.0–36.0)
MCV: 90.8 fL (ref 80.0–100.0)
NRBC: 0 % (ref 0.0–0.2)
Platelets: 125 10*3/uL — ABNORMAL LOW (ref 150–400)
RBC: 5.24 MIL/uL (ref 4.22–5.81)
RDW: 13.1 % (ref 11.5–15.5)
WBC: 5.8 10*3/uL (ref 4.0–10.5)

## 2019-01-24 LAB — URINALYSIS, ROUTINE W REFLEX MICROSCOPIC
Bacteria, UA: NONE SEEN
Bilirubin Urine: NEGATIVE
Glucose, UA: >=500 mg/dL — AB
Hgb urine dipstick: NEGATIVE
Ketones, ur: NEGATIVE mg/dL
Leukocytes,Ua: NEGATIVE
Nitrite: NEGATIVE
PROTEIN: NEGATIVE mg/dL
Specific Gravity, Urine: 1.023 (ref 1.005–1.030)
Squamous Epithelial / HPF: NONE SEEN (ref 0–5)
pH: 5 (ref 5.0–8.0)

## 2019-01-24 LAB — BASIC METABOLIC PANEL
Anion gap: 10 (ref 5–15)
BUN: 24 mg/dL — ABNORMAL HIGH (ref 6–20)
CO2: 21 mmol/L — ABNORMAL LOW (ref 22–32)
Calcium: 9.3 mg/dL (ref 8.9–10.3)
Chloride: 103 mmol/L (ref 98–111)
Creatinine, Ser: 0.91 mg/dL (ref 0.61–1.24)
GFR calc Af Amer: >60 mL/min (ref >60)
GFR calc non Af Amer: >60 mL/min (ref >60)
GLUCOSE: 230 mg/dL — AB (ref 70–99)
Potassium: 4.5 mmol/L (ref 3.5–5.1)
Sodium: 134 mmol/L — ABNORMAL LOW (ref 135–145)

## 2019-01-24 LAB — HEMOGLOBIN A1C
Hgb A1c MFr Bld: 8.8 % — ABNORMAL HIGH (ref 4.8–5.6)
Mean Plasma Glucose: 205.86 mg/dL

## 2019-01-24 LAB — PROTIME-INR
INR: 1.1 (ref 0.8–1.2)
Prothrombin Time: 14 seconds (ref 11.4–15.2)

## 2019-01-24 LAB — APTT: aPTT: 36 seconds (ref 24–36)

## 2019-01-24 NOTE — Patient Instructions (Signed)
Your procedure is scheduled on: Wednesday 02/13/19.    Report to DAY SURGERY DEPARTMENT LOCATED ON 2ND FLOOR MEDICAL MALL ENTRANCE. To find out your arrival time please call [phone] between 1PM - 3PM on Tuesday 02/12/19.    Remember: Instructions that are not followed completely may result in serious medical risk, up to and including death, or upon the discretion of your surgeon and anesthesiologist your surgery may need to be rescheduled.      _X__ 1. Do not eat food after midnight the night before your procedure.                 No gum chewing or hard candies. You may drink WATER up to 2 hours                 before you are scheduled to arrive for your surgery- DO NOT water within 2 hours of the start of your surgery.                    __X__2.  On the morning of surgery brush your teeth with toothpaste and water, you may rinse your mouth with mouthwash if you wish.  Do not swallow any toothpaste or mouthwash.      _X__ 3.  No Alcohol for 24 hours before or after surgery.   __X__4.  Notify your doctor if there is any change in your medical condition      (cold, fever, infections).     Do not wear jewelry, make-up, hairpins, clips or nail polish. Do not wear lotions, powders, or perfumes.  Do not shave 48 hours prior to surgery. Men may shave face and neck. Do not bring valuables to the hospital.     [facility] is not responsible for any belongings or valuables.   Contacts, dentures/partials or body piercings may not be worn into surgery. Bring a case for your contacts, glasses or hearing aids, a denture cup will be supplied.    Patients discharged the day of surgery will not be allowed to drive home.     Please read over the following fact sheets that you were given:   MRSA Information   __X__ Take these medicines the morning of surgery with A SIP OF WATER:     1. fexofenadine (ALLEGRA) 180 MG tablet  2. metoprolol succinate (TOPROL-XL) 50 MG 24 hr  tablet   __X__ You may take the following medications if you need them:   1. acetaminophen (TYLENOL) 500 MG tablet  2. baclofen (LIORESAL) 10 MG tablet      __X__ Use CHG Soap as directed    __X__ Stop Metformin 2 days prior to surgery. Your last dose will be on Sunday 02/10/19.    __X__ Stop Anti-inflammatories 7 days before surgery such as Advil, Ibuprofen, Motrin, BC or Goodies Powder, Naprosyn, Naproxen, Aleve, Aspirin, Meloxicam. Last dose will be on 02/06/19. May take Tylenol if needed for pain or discomfort.    __X__ Do not begin taking any new herbal supplements until after your procedure.    __X__ Bring C-Pap to the hospital.

## 2019-01-24 NOTE — Pre-Procedure Instructions (Signed)
HGBA1C ADDED TO LABS PER DR Izora Ribas.

## 2019-01-28 ENCOUNTER — Ambulatory Visit: Payer: PRIVATE HEALTH INSURANCE | Admitting: Physical Therapy

## 2019-01-28 ENCOUNTER — Encounter: Payer: Self-pay | Admitting: Physical Therapy

## 2019-01-28 DIAGNOSIS — G8929 Other chronic pain: Secondary | ICD-10-CM

## 2019-01-28 DIAGNOSIS — M5441 Lumbago with sciatica, right side: Principal | ICD-10-CM

## 2019-01-28 DIAGNOSIS — M62838 Other muscle spasm: Secondary | ICD-10-CM

## 2019-01-28 DIAGNOSIS — M5442 Lumbago with sciatica, left side: Principal | ICD-10-CM

## 2019-01-28 DIAGNOSIS — R202 Paresthesia of skin: Secondary | ICD-10-CM

## 2019-01-28 DIAGNOSIS — R262 Difficulty in walking, not elsewhere classified: Secondary | ICD-10-CM

## 2019-01-28 NOTE — Therapy (Signed)
[facility] [address] [place], [address] Phone: [phone]   Fax:  [phone]  Physical Therapy Treatment  Patient Details  Name: Cory Perez MRN: [id number] Date of Birth: 11/27/1965 Referring Provider (PT): Edwyna Perfect, [place]   Encounter Date: 01/28/2019  PT End of Session - 01/28/19 1750    Visit Number  9    Number of Visits  12    Date for PT Re-Evaluation  02/04/19    Authorization Type  [employer] reporting period from 12/24/2018    Authorization Time Period  Current Cert period: 02/20/3243 - 02/04/2019 (latest PN: IE 12/24/2018)    Authorization - Visit Number  9    Authorization - Number of Visits  12    PT Start Time  1742    PT Stop Time  1820    PT Time Calculation (min)  38 min    Activity Tolerance  Patient tolerated treatment well    Behavior During Therapy  [facility] for tasks assessed/performed       Past Medical History:  Diagnosis Date  . Abnormal results of liver function studies   . Aldosterone excess (Conn syndrome) ([place])   . Anxiety   . Arrhythmia   . Atrial flutter, paroxysmal ([place]) 12/24/2018   Sp ablation 2017  . Depression   . DJD (degenerative joint disease)   . DM2 (diabetes mellitus, type 2) ([place])   . History of chicken pox   . History of syncope   . HLD (hyperlipidemia)   . HTN (hypertension)   . Hx of atrioventricular node ablation   . Hypersomnia with sleep apnea   . Lumbar spinal stenosis   . Other forms of angina pectoris ([place])    Other and unspecified angina pectoris  . Psoriasis   . Seasonal allergies   . Seborrheic dermatitis   . Sleep apnea     Past Surgical History:  Procedure Laterality Date  . ADRENALECTOMY Right 02/20/2016 - 03/20/2016  . COLONOSCOPY  12/05/2003, 09/05/2002   Dr. Chauncey Cruel. [place] @ [place] - Adenomatous Polyps, [facility] (sister)  . JOINT REPLACEMENT Left 02/12/2014   Left knee medial MAKOplasty  . TONSILLECTOMY    . UPP surgery on throat     . VASECTOMY      There were no vitals filed for this visit.  Subjective Assessment - 01/28/19 1747    Subjective  Patient reports right glute pain of 4/10 and low back pain of 2/10. He reports he is tired from cleaning another basin out but he had help today. He reports he had no excessive pain or soreness following last treatment session. He had his pre-op consultation last week and it went well.     Pertinent History  Patient is a 53 y.o. male who presents to outpatient physical therapy with a referral for medical diagnosis neurogenic claudication. This patient's chief complaints consist of low back pain, leg pain and paresthesia, difficulty walking, limited activity tolerance leading to the following functional deficits: difficulty with any weight bearing activity, difficulty walking, with ADLs, IADLs, work tasks and usual household and social activities. Relevant past medical history and comorbidities include type 2 diabetes mellitus, primary aldosteronism, history of atrial flutter with ablation, left partial knee arthroplasty, anxiety, depression, sleep apnea, psoriasis, hypertension.     Limitations  Walking;Lifting;House hold activities;Standing;Other (comment)   all weight bearing activities   How long can you sit comfortably?  1- 2 hours    How long  can you stand comfortably?  < 15 min    How long can you walk comfortably?  none at times    Diagnostic tests  Imaging: MRI report dated 11/20/2019: "IMPRESSION: 1. No acute osseous abnormality. Mild progression of L4-5 grade 1 anterolisthesis. 2. Progression of lumbar spondylosis greatest at L3-4 and L4-5. 3. Severe L3-4 and L4-5 spinal canal stenosis. Crowding of cauda equina above L3-4 level likely due to high-grade stenosis. 4. Moderate L3-4, moderate L4-5, and mild L5-S1 foraminal stenosis."    Patient Stated Goals  would like to be able to return to PLOF and valued activities without limitation    Currently in Pain?  Yes    Pain Score  4      Pain Location  Back    Pain Orientation  Right    Pain Descriptors / Indicators  Aching    Pain Type  Chronic pain    Pain Radiating Towards  right glute    Pain Onset  More than a month ago    Pain Frequency  Constant      TREATMENT: Therapeutic exercise:to centralize symptoms and improve ROM, strength, muscular endurance, and activity tolerance required for successful completion of functional activities. -NuStep level10using bilateral upper and lower extremities. Seat and hand setting16. For improved extremity mobility, muscular endurance, and activity tolerance; and to induce the analgesic effect of aerobic exercise, stimulate improved joint nutrition, and prepare body structures and systems for following interventions. x6Minutes during subjective exam. - seated lumbar flexion ball roll out with large blue theraball to stretch low back into flexion.36min forwards, x2 min alternating diagonals. Pt completed the first few with knees extended to stretch nerves pt reports feels good at first, but then reports increased burring posterior bilateral legs so bent knees to decrease nerve stretch. -Standing pallof press (multifidus press)with double black bandx20each side. Cuing for trunk stability. To improve trunk strength.  - foam roller wall slides to stretch thoracic spine and bilateral lats, 3 second hold, x2 min. Cuing for technique. - hooklying sciatic nerve glide with towel behind leg to help support it, x 10 each side. Cuing for form.   Manual therapy:to reduce pain and tissue tension, improve range of motion, neuromodulation, in order to promote improved ability to complete functional activities. - Prone CPA along lumbar and thoracic spine grade I-IV with and without wedge assist focusing on tender points and sections pt reports relieves pain and tension. - STM with and without foam roller assist over posterior spinal muscles of the lumbar and thoracic  spine.   HOME EXERCISE PROGRAM Access Code: [email or web address]  URL: [email or web address]  Date: 01/23/2019  Prepared by: Rosita Kea   Exercises   Supine Lower Trunk Rotation - 10-15 reps - 1 second hold - 3 Sets - 1x daily - 3x weekly   Supine Bridge with Spinal Articulation - 2 sets - 10 reps - 1-5 seconds hold   Supine LE Neural Mobilization - 15 reps - 1 second hold - 1 Sets - 1x daily - 7x weekly   Quadruped Thoracic Rotation with Hand on Neck - 10-15 reps - 1 second hold - 2 Sets - 1x daily - 7x weekly      PT Education - 01/28/19 1749    Education Details  Exercise purpose/form. Self management techniques. Education on diagnosis, prognosis, POC, anatomy and physiology of current condition     Person(s) Educated  Patient    Methods  Explanation;Demonstration;Tactile cues;Verbal cues    Comprehension  Verbalized understanding;Returned demonstration  PT Short Term Goals - 01/09/19 1825      PT SHORT TERM GOAL #1   Title  Be independent with initial home exercise program for self-management of symptoms.    Baseline  Initial HEP to be provided at 2nd visit    Time  2    Period  Weeks    Status  On-going    Target Date  01/23/19        PT Long Term Goals - 12/24/18 2000      PT LONG TERM GOAL #1   Title  Be independent with a long-term home exercise program for self-management of symptoms.     Baseline  Initial HEP to be provied at 2nd visit (12/24/2018);    Time  6    Period  Weeks    Status  New    Target Date  02/04/19      PT LONG TERM GOAL #2   Title  Demonstrate improved FOTO score by 10 units to demonstrate improvement in overall condition and self-reported functional ability.     Baseline  FOTO = 45 (12/24/2018);     Time  6    Period  Weeks    Status  New    Target Date  02/04/19      PT LONG TERM GOAL #3   Title  Reduce pain to 3/10 with functional activities to allow patient to complete valued functional tasks such as walking with  less difficulty    Baseline  pain up to 8/10 (12/24/2018);     Time  6    Period  Weeks    Status  New    Target Date  02/04/19      PT LONG TERM GOAL #4   Title  Patient will improve standing/walking tolerance to equal or greater than 1 hour to improve ability to perform work duties.     Baseline  0-15 min (12/24/2018);     Time  6    Period  Weeks    Status  New      PT LONG TERM GOAL #5   Title  Complete community, work and/or recreational activities without limitation due to current condition.     Baseline  very limited in all weight bearing activities (see subjective exam, 12/24/2018);     Time  8    Period  Weeks    Status  New    Target Date  02/04/19            Plan - 01/28/19 2004    Clinical Impression Statement  Pt tolerated treatmentwell.He was able to perform all exercises without increased pain and reported improvement in comfort following manual. . Did not progress exercises due to fatigue upon arrival. Pt required multimodal cuing for proper technique and to facilitate improved neuromuscular control, strength, range of motion, and functional ability resulting in improved performance and form.Patient will benefit from skilled physical therapy intervention to address current body structure impairments and activity limitations to improve function and work towards goals set in current POC in order to return to prior level of function or maximal functional improvement. Patient would also benefit from continued medical assessment and monitoring for spinal cord and cauda equina compression. Patient's referring clinician has been notified of symptoms consistent with cauda equina syndrome    Rehab Potential  Fair    Clinical Impairments Affecting Rehab Potential  (+) motivation, (-) severity and chronicity of condition, comorbidities, MRI findings showing encroachment on cauda equina  PT Frequency  2x / week    PT Duration  6 weeks    PT Treatment/Interventions  ADLs/Self  Care Home Management;Aquatic Therapy;Moist Heat;Cryotherapy;Electrical Stimulation;Gait training;Stair training;Functional mobility training;Therapeutic activities;Therapeutic exercise;Balance training;Neuromuscular re-education;Patient/family education;Manual techniques;Passive range of motion;Dry needling;Joint Manipulations;Other (comment)   joint mobilizations grades I-IV   PT Next Visit Plan  continue progressive mobility and strengthening as tolerated.     PT Home Exercise Plan  Medbridge Access Code: [email or web address]     Consulted and Agree with Plan of Care  Patient       Patient will benefit from skilled therapeutic intervention in order to improve the following deficits and impairments:  Abnormal gait, Decreased balance, Decreased endurance, Decreased mobility, Difficulty walking, Hypomobility, Increased muscle spasms, Impaired sensation, Decreased range of motion, Impaired perceived functional ability, Obesity, Decreased activity tolerance, Impaired flexibility, Pain  Visit Diagnosis: Chronic bilateral low back pain with bilateral sciatica  Paresthesia of skin  Other muscle spasm  Difficulty in walking, not elsewhere classified     Problem List Patient Active Problem List   Diagnosis Date Noted  . Atrial flutter, paroxysmal ([place]) 12/24/2018  . OSA (obstructive sleep apnea) 12/24/2018  . Psoriasis 12/24/2018  . Primary hyperaldosteronism ([place]) 12/24/2018  . Type 2 diabetes mellitus with diabetic polyneuropathy, without long-term current use of insulin ([place]) 12/24/2018  . Hypertension associated with diabetes ([place]) 12/24/2018  . Hyperlipidemia associated with type 2 diabetes mellitus ([place]) 12/24/2018  . Hypersomnia with sleep apnea 12/24/2018  . Lumbar stenosis with neurogenic claudication 08/22/2014  . Lumbar radiculitis 08/22/2014  . Status post left partial knee replacement 07/31/2014  . Low back pain 07/31/2014  . Aftercare following joint replacement 04/17/2014    Nancy Nordmann, PT, DPT 01/28/2019, 8:05 PM  [facility] [address] [place], [address] Phone: [phone]   Fax:  [phone]  Name: KARINA LENDERMAN MRN: [id number] Date of Birth: 07-17-1966

## 2019-01-30 ENCOUNTER — Other Ambulatory Visit: Payer: Self-pay

## 2019-01-30 ENCOUNTER — Ambulatory Visit: Payer: PRIVATE HEALTH INSURANCE | Admitting: Physical Therapy

## 2019-01-30 DIAGNOSIS — M5441 Lumbago with sciatica, right side: Principal | ICD-10-CM

## 2019-01-30 DIAGNOSIS — M5442 Lumbago with sciatica, left side: Principal | ICD-10-CM

## 2019-01-30 DIAGNOSIS — G8929 Other chronic pain: Secondary | ICD-10-CM

## 2019-01-30 DIAGNOSIS — R262 Difficulty in walking, not elsewhere classified: Secondary | ICD-10-CM

## 2019-01-30 DIAGNOSIS — R202 Paresthesia of skin: Secondary | ICD-10-CM

## 2019-01-30 DIAGNOSIS — M62838 Other muscle spasm: Secondary | ICD-10-CM

## 2019-01-30 NOTE — Therapy (Signed)
[facility] [address] [place], [address] Phone: [phone]   Fax:  [phone]  Physical Therapy Treatment and Discharge Summary Reporting period: 12/24/2018 - 01/30/2019  Patient Details  Name: Cory Perez MRN: [id number] Date of Birth: 08/20/1966 Referring Provider (PT): Edwyna Perfect, [place]   Encounter Date: 01/30/2019  PT End of Session - 01/30/19 1920    Visit Number  10    Number of Visits  12    Date for PT Re-Evaluation  02/04/19    Authorization Type  [employer] reporting period from 12/24/2018    Authorization Time Period  Current Cert period: 06/24/6658 - 02/04/2019 (latest PN: IE 12/24/2018)    Authorization - Visit Number  10    Authorization - Number of Visits  10    PT Start Time  1735    PT Stop Time  1815    PT Time Calculation (min)  40 min    Activity Tolerance  Patient tolerated treatment well    Behavior During Therapy  [facility] for tasks assessed/performed       Past Medical History:  Diagnosis Date  . Abnormal results of liver function studies   . Aldosterone excess (Conn syndrome) ([place])   . Anxiety   . Arrhythmia   . Atrial flutter, paroxysmal ([place]) 12/24/2018   Sp ablation 2017  . Depression   . DJD (degenerative joint disease)   . DM2 (diabetes mellitus, type 2) ([place])   . History of chicken pox   . History of syncope   . HLD (hyperlipidemia)   . HTN (hypertension)   . Hx of atrioventricular node ablation   . Hypersomnia with sleep apnea   . Lumbar spinal stenosis   . Other forms of angina pectoris ([place])    Other and unspecified angina pectoris  . Psoriasis   . Seasonal allergies   . Seborrheic dermatitis   . Sleep apnea     Past Surgical History:  Procedure Laterality Date  . ADRENALECTOMY Right 02/20/2016 - 03/20/2016  . COLONOSCOPY  12/05/2003, 09/05/2002   Dr. Chauncey Cruel. [place] @ [place] - Adenomatous Polyps, [facility] (sister)  . JOINT REPLACEMENT Left 02/12/2014   Left knee  medial MAKOplasty  . TONSILLECTOMY    . UPP surgery on throat    . VASECTOMY      There were no vitals filed for this visit.  Subjective Assessment - 01/30/19 1746    Subjective  Patient reports his right glute pain has increased to 6/10 and is causing him to limp some. He is very dissapointed today because he found an old message in myChart from his pre-op appt that said his A1C was higher than the doctor wanted for surgery and they want to delay for another month. It was 8.8 but should be below 8. He messaged them back to ask if the steroid shots he had may have artificially raised his A1C, but hasn't heard back. He reports he feels it is nice to get some exercise but overall he does not feel that PT is making a significant difference in his back pain and he still would like today to be his last visit at this point.     Pertinent History  Patient is a 53 y.o. male who presents to outpatient physical therapy with a referral for medical diagnosis neurogenic claudication. This patient's chief complaints consist of low back pain, leg pain and paresthesia, difficulty walking, limited activity tolerance leading to the following functional  deficits: difficulty with any weight bearing activity, difficulty walking, with ADLs, IADLs, work tasks and usual household and social activities. Relevant past medical history and comorbidities include type 2 diabetes mellitus, primary aldosteronism, history of atrial flutter with ablation, left partial knee arthroplasty, anxiety, depression, sleep apnea, psoriasis, hypertension.     Limitations  Walking;Lifting;House hold activities;Standing;Other (comment)   all weight bearing activities   How long can you sit comfortably?  indefinitely    How long can you stand comfortably?  < 10  min    How long can you walk comfortably?  < 10 min    Diagnostic tests  Imaging: MRI report dated 11/20/2019: "IMPRESSION: 1. No acute osseous abnormality. Mild progression of L4-5 grade  1 anterolisthesis. 2. Progression of lumbar spondylosis greatest at L3-4 and L4-5. 3. Severe L3-4 and L4-5 spinal canal stenosis. Crowding of cauda equina above L3-4 level likely due to high-grade stenosis. 4. Moderate L3-4, moderate L4-5, and mild L5-S1 foraminal stenosis."    Patient Stated Goals  would like to be able to return to PLOF and valued activities without limitation    Currently in Pain?  Yes    Pain Score  3     Pain Location  Back    Pain Orientation  Right    Pain Descriptors / Indicators  Jabbing;Aching;Throbbing   deep   Pain Type  Chronic pain    Pain Radiating Towards  right glute,     Pain Onset  More than a month ago    Aggravating Factors   when on feet for prolonged period of time, lifting, carrying repeatedly, walking    Pain Relieving Factors  sitting (unless chair is hard).     Effect of Pain on Daily Activities  Patient reports it hurts all time time in varying degrees. It has progressed from a minor inconvenience but now hampers just about every part of his life, from work to just about everything he does. It is from impossible to very difficult. It is affecting his mood, what he can do and can't do, always thinking about it and what he is going to do. Makes things harder at home, makes things harder at work. "Generally across the board it has invaded pretty much every aspect of my life." Now it feels like from the waste down he cannot feel much to varying degrees (points to left lower leg). Affects balance, but has not fallen      [facility] PT Assessment - 01/30/19 0001      Assessment   Medical Diagnosis  neurogenic claudication    Referring Provider (PT)  Edwyna Perfect, PA    Next MD Visit  waiting appt with Dr. 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 50/50 deck work and physical work, doesn't do anything extremely physical except lifting those pails and that is about 1x week.     Leisure   sit in  a chair now. Used to enjoy walking and exercise but cannot really do anything now, collecting muscle cars and antiques      Cognition   Overall Cognitive Status  Within Functional Limits for tasks assessed      Observation/Other Assessments   Observations  see note from 01/30/2019 for latest objective data    Focus on Therapeutic Outcomes (FOTO)   55       OBJECTIVE: OBSERVATION/INSPECTION: Patient presents with reduced lumbar lordosis.   FUNCTIONAL MOBILITY:  Bed mobility: Sit <> supine, and rolling side <> back I with difficulty due to pain.   Transfers: sit <> stand I with difficulty due to pain.   Gait: ambulates with slightly stooped posture > 1300 feet.   FUNCTIONAL/BALANCE TESTS: Tandem walking: 4 steps unsteady, increased pain.  Backwards walking: I for 10 feet without LOB, no gross ataxia noted.  Single leg stance, firm surface, eyes open: R= 19 seconds, L= 4 seconds Six Minute Walk Test: not tested due to pain  Objective measurements completed on examination: See above findings.   TREATMENT: Therapeutic exercise:to centralize symptoms and improve ROM, strength, muscular endurance, and activity tolerance required for successful completion of functional activities. -NuStep level10using bilateral upper and lower extremities. Seat and hand setting16. For improved extremity mobility, muscular endurance, and activity tolerance; and to induce the analgesic effect of aerobic exercise, stimulate improved joint nutrition, and prepare body structures and systems for  following interventions. x6Minutes during subjective exam. - seated lumbar flexion ball roll out with large blue theraball to stretch low back into flexion.7mn forwards, x2 min alternating diagonals. Pt completed the first few with knees extended to stretch nerves pt reports feels good at first, but then reports increased burring posterior bilateral legs so bent knees to decrease nerve stretch. - educated pt on how to use LAX ball for self STM at tender glute, resulting in decreased pain.  - measurements to assess progress (see above).   Manual therapy:to reduce pain and tissue tension, improve range of motion, neuromodulation, in order to promote improved ability to complete functional activities. - Prone CPA along lumbar and thoracic spine grade I-IV focusing on tender points and sections pt reports relieves pain and tension. - STM posterior spinal muscles of the lumbar region and STM with trigger point release at the right gluteal region resulting in decreased pain.    HOME EXERCISE PROGRAM Access Code: [email or web address] URL: [email or web address]  Date: 01/23/2019  Prepared by: SRosita Kea  Exercises   Supine Lower Trunk Rotation - 10-15 reps - 1 second hold - 3 Sets - 1x daily - 3x weekly   Supine Bridge with Spinal Articulation - 2 sets - 10 reps - 1-5 seconds hold   Supine LE Neural Mobilization - 15 reps - 1 second hold - 1 Sets - 1x daily - 7x weekly   Quadruped Thoracic Rotation with Hand on Neck - 10-15 reps - 1 second hold - 2 Sets - 1x daily - 7x weekly    PT Education - 01/30/19 1919    Education Details  Exercise purpose/form. Self management techniques. Education on diagnosis, prognosis, POC, anatomy and physiology of current condition. Reccomendations for self care at discharge    Person(s) Educated  Patient    Methods  Explanation;Demonstration;Tactile cues    Comprehension  Verbalized understanding;Returned demonstration       PT Short Term  Goals - 01/30/19 1756      PT SHORT TERM GOAL #1   Title  Be independent  with initial home exercise program for self-management of symptoms.    Baseline  Initial HEP to be provided at 2nd visit    Time  2    Period  Weeks    Status  Not Met    Target Date  01/23/19        PT Long Term Goals - 01/30/19 1757      PT LONG TERM GOAL #1   Title  Be independent with a long-term home exercise program for self-management of symptoms.     Baseline  Initial HEP to be provied at 2nd visit (12/24/2018);    Time  6    Period  Weeks    Status  Not Met    Target Date  02/04/19      PT LONG TERM GOAL #2   Title  Demonstrate improved FOTO score by 10 units to demonstrate improvement in overall condition and self-reported functional ability.     Baseline  FOTO = 45 (12/24/2018); 55 (01/30/2019)    Time  6    Period  Weeks    Status  Achieved    Target Date  02/04/19      PT LONG TERM GOAL #3   Title  Reduce pain to 3/10 with functional activities to allow patient to complete valued functional tasks such as walking with less difficulty    Baseline  pain up to 8/10 (12/24/2018); pain up to 8/10 (01/30/2019):     Time  6    Period  Weeks    Status  Not Met    Target Date  02/04/19      PT LONG TERM GOAL #4   Title  Patient will improve standing/walking tolerance to equal or greater than 1 hour to improve ability to perform work duties.     Baseline  0-15 min (12/24/2018); 0-15 min (01/30/2019)    Time  6    Period  Weeks    Status  Not Met    Target Date  02/04/19      PT LONG TERM GOAL #5   Title  Complete community, work and/or recreational activities without limitation due to current condition.     Baseline  very limited in all weight bearing activities (see subjective exam, 12/24/2018); reports no significant change (01/30/2019).    Time  8    Period  Weeks    Status  Not Met    Target Date  02/04/19            Plan - 01/30/19 1919    Clinical Impression Statement  Patient has  attended 10 physical therapy treatment sessions this episode of care. He has met one long term goal and has made minimal progress towards remaining goals. Subjectively, he reports he enjoys coming to physical therapy but does not feel he is getting any long term benefit from it. He was scheduled for surgery in the next two weeks but learned that may need to be pushed back a month due to elevated A1C. At this point, he feels confident in proceeding independently with self-care for his condition until that time. Objectively, he demonstrates modest improvements in right SLS balance but he was unable to attempt the 6MWT today due to increased glute pain and does not demonstrate overall improvement. Patient is not participating in his HEP and reports he has no plans to at this point, so his exorcises were not updated from those previously provided. He is now discharged from skilled physical therapy.  Rehab Potential  Fair    Clinical Impairments Affecting Rehab Potential  (+) motivation, (-) severity and chronicity of condition, comorbidities, MRI findings showing encroachment on cauda equina    PT Frequency  2x / week    PT Duration  6 weeks    PT Treatment/Interventions  ADLs/Self Care Home Management;Aquatic Therapy;Moist Heat;Cryotherapy;Electrical Stimulation;Gait training;Stair training;Functional mobility training;Therapeutic activities;Therapeutic exercise;Balance training;Neuromuscular re-education;Patient/family education;Manual techniques;Passive range of motion;Dry needling;Joint Manipulations;Other (comment)   joint mobilizations grades I-IV   PT Next Visit Plan  patient is now disharged from physical therapy due to lack of meaningful progress.     PT Home Exercise Plan  [employer] Access Code: [email or web address]     Consulted and Agree with Plan of Care  Patient       Patient will benefit from skilled therapeutic intervention in order to improve the following deficits and impairments:  Abnormal gait,  Decreased balance, Decreased endurance, Decreased mobility, Difficulty walking, Hypomobility, Increased muscle spasms, Impaired sensation, Decreased range of motion, Impaired perceived functional ability, Obesity, Decreased activity tolerance, Impaired flexibility, Pain  Visit Diagnosis: Chronic bilateral low back pain with bilateral sciatica  Paresthesia of skin  Other muscle spasm  Difficulty in walking, not elsewhere classified     Problem List Patient Active Problem List   Diagnosis Date Noted  . Atrial flutter, paroxysmal ([place]) 12/24/2018  . OSA (obstructive sleep apnea) 12/24/2018  . Psoriasis 12/24/2018  . Primary hyperaldosteronism ([place]) 12/24/2018  . Type 2 diabetes mellitus with diabetic polyneuropathy, without long-term current use of insulin ([place]) 12/24/2018  . Hypertension associated with diabetes ([place]) 12/24/2018  . Hyperlipidemia associated with type 2 diabetes mellitus ([place]) 12/24/2018  . Hypersomnia with sleep apnea 12/24/2018  . Lumbar stenosis with neurogenic claudication 08/22/2014  . Lumbar radiculitis 08/22/2014  . Status post left partial knee replacement 07/31/2014  . Low back pain 07/31/2014  . Aftercare following joint replacement 04/17/2014    Nancy Nordmann, PT, DPT 01/30/2019, 7:21 PM  [facility] [address] [place], [address] Phone: [phone]   Fax:  [phone]  Name: Cory Perez MRN: [id number] Date of Birth: 04/15/1966

## 2019-01-31 ENCOUNTER — Other Ambulatory Visit: Payer: Self-pay

## 2019-02-04 ENCOUNTER — Ambulatory Visit: Payer: PRIVATE HEALTH INSURANCE | Admitting: Physical Therapy

## 2019-02-06 ENCOUNTER — Ambulatory Visit: Payer: PRIVATE HEALTH INSURANCE | Admitting: Physical Therapy

## 2019-02-11 ENCOUNTER — Encounter: Payer: PRIVATE HEALTH INSURANCE | Admitting: Physical Therapy

## 2019-02-13 ENCOUNTER — Encounter: Payer: PRIVATE HEALTH INSURANCE | Admitting: Physical Therapy

## 2019-03-04 NOTE — Consult Note (Signed)
Pharmacy Antibiotic Note  Cory Perez is a 53 y.o. male will be admitted on 03/06/2019 with surgical prophylaxis. Patient is scheduled for a lumbar laminectomy/decompression microdiscectomy. Pharmacy has been consulted for Cefazolin dosing.   Patient's weight was last recorded on 01/24/2019: 130.7 kg   Given his weight, will do weight based dosing for >120 kg   Plan: Start 3 g Cefazolin x 1 prior to procedure.      No data recorded.  No results for input(s): WBC, CREATININE, LATICACIDVEN, VANCOTROUGH, VANCOPEAK, VANCORANDOM, GENTTROUGH, GENTPEAK, GENTRANDOM, TOBRATROUGH, TOBRAPEAK, TOBRARND, AMIKACINPEAK, AMIKACINTROU, AMIKACIN in the last 168 hours.  CrCl cannot be calculated (Patient's most recent lab result is older than the maximum 21 days allowed.).    Allergies  Allergen Reactions  . Ketoconazole Hives    Thank you for allowing pharmacy to be a part of this patient's care.  Rowland Lathe, PharmD 03/04/2019 9:07 AM

## 2019-03-05 MED ORDER — CEFAZOLIN (ANCEF) 1 G IV SOLR
3.0000 g | INTRAVENOUS | Status: DC
  Filled 2019-03-05: qty 3

## 2019-03-06 ENCOUNTER — Ambulatory Visit: Payer: PRIVATE HEALTH INSURANCE | Admitting: Anesthesiology

## 2019-03-06 ENCOUNTER — Ambulatory Visit: Payer: PRIVATE HEALTH INSURANCE

## 2019-03-06 ENCOUNTER — Encounter
Admission: RE | Disposition: A | Discharge status: Home / Self Care | Payer: Self-pay | Source: Ambulatory Visit | Attending: Neurosurgery

## 2019-03-06 ENCOUNTER — Observation Stay
Admission: RE | Admit: 2019-03-06 | Discharge: 2019-03-07 | Disposition: A | Discharge status: Home / Self Care | Payer: PRIVATE HEALTH INSURANCE | Source: Ambulatory Visit | Attending: Neurosurgery | Admitting: Neurosurgery

## 2019-03-06 ENCOUNTER — Other Ambulatory Visit: Payer: Self-pay

## 2019-03-06 DIAGNOSIS — Z79899 Other long term (current) drug therapy: Secondary | ICD-10-CM | POA: Insufficient documentation

## 2019-03-06 DIAGNOSIS — Z96652 Presence of left artificial knee joint: Secondary | ICD-10-CM | POA: Diagnosis not present

## 2019-03-06 DIAGNOSIS — Z87891 Personal history of nicotine dependence: Secondary | ICD-10-CM | POA: Insufficient documentation

## 2019-03-06 DIAGNOSIS — M48062 Spinal stenosis, lumbar region with neurogenic claudication: Principal | ICD-10-CM | POA: Insufficient documentation

## 2019-03-06 DIAGNOSIS — I499 Cardiac arrhythmia, unspecified: Secondary | ICD-10-CM | POA: Insufficient documentation

## 2019-03-06 DIAGNOSIS — Z833 Family history of diabetes mellitus: Secondary | ICD-10-CM | POA: Insufficient documentation

## 2019-03-06 DIAGNOSIS — G4710 Hypersomnia, unspecified: Secondary | ICD-10-CM | POA: Diagnosis not present

## 2019-03-06 DIAGNOSIS — G4730 Sleep apnea, unspecified: Secondary | ICD-10-CM | POA: Diagnosis not present

## 2019-03-06 DIAGNOSIS — L219 Seborrheic dermatitis, unspecified: Secondary | ICD-10-CM | POA: Diagnosis not present

## 2019-03-06 DIAGNOSIS — Z883 Allergy status to other anti-infective agents status: Secondary | ICD-10-CM | POA: Insufficient documentation

## 2019-03-06 DIAGNOSIS — Z419 Encounter for procedure for purposes other than remedying health state, unspecified: Secondary | ICD-10-CM

## 2019-03-06 DIAGNOSIS — E119 Type 2 diabetes mellitus without complications: Secondary | ICD-10-CM | POA: Diagnosis not present

## 2019-03-06 DIAGNOSIS — F419 Anxiety disorder, unspecified: Secondary | ICD-10-CM | POA: Insufficient documentation

## 2019-03-06 DIAGNOSIS — I4892 Unspecified atrial flutter: Secondary | ICD-10-CM | POA: Insufficient documentation

## 2019-03-06 DIAGNOSIS — L409 Psoriasis, unspecified: Secondary | ICD-10-CM | POA: Insufficient documentation

## 2019-03-06 DIAGNOSIS — G834 Cauda equina syndrome: Secondary | ICD-10-CM | POA: Insufficient documentation

## 2019-03-06 DIAGNOSIS — Z794 Long term (current) use of insulin: Secondary | ICD-10-CM | POA: Insufficient documentation

## 2019-03-06 DIAGNOSIS — F329 Major depressive disorder, single episode, unspecified: Secondary | ICD-10-CM | POA: Insufficient documentation

## 2019-03-06 DIAGNOSIS — Z9889 Other specified postprocedural states: Secondary | ICD-10-CM

## 2019-03-06 DIAGNOSIS — I10 Essential (primary) hypertension: Secondary | ICD-10-CM | POA: Insufficient documentation

## 2019-03-06 DIAGNOSIS — E785 Hyperlipidemia, unspecified: Secondary | ICD-10-CM | POA: Insufficient documentation

## 2019-03-06 DIAGNOSIS — E896 Postprocedural adrenocortical (-medullary) hypofunction: Secondary | ICD-10-CM | POA: Diagnosis not present

## 2019-03-06 DIAGNOSIS — Z8249 Family history of ischemic heart disease and other diseases of the circulatory system: Secondary | ICD-10-CM | POA: Diagnosis not present

## 2019-03-06 DIAGNOSIS — M4316 Spondylolisthesis, lumbar region: Secondary | ICD-10-CM | POA: Insufficient documentation

## 2019-03-06 DIAGNOSIS — Z800 Family history of malignant neoplasm of digestive organs: Secondary | ICD-10-CM | POA: Insufficient documentation

## 2019-03-06 DIAGNOSIS — Z840 Family history of diseases of the skin and subcutaneous tissue: Secondary | ICD-10-CM | POA: Insufficient documentation

## 2019-03-06 DIAGNOSIS — M1990 Unspecified osteoarthritis, unspecified site: Secondary | ICD-10-CM | POA: Insufficient documentation

## 2019-03-06 DIAGNOSIS — G709 Myoneural disorder, unspecified: Secondary | ICD-10-CM | POA: Insufficient documentation

## 2019-03-06 HISTORY — PX: LUMBAR LAMINECTOMY/DECOMPRESSION MICRODISCECTOMY: SHX5026

## 2019-03-06 LAB — GLUCOSE, CAPILLARY
Glucose-Capillary: 163 mg/dL — ABNORMAL HIGH (ref 70–99)
Glucose-Capillary: 173 mg/dL — ABNORMAL HIGH (ref 70–99)
Glucose-Capillary: 237 mg/dL — ABNORMAL HIGH (ref 70–99)

## 2019-03-06 SURGERY — LUMBAR LAMINECTOMY/DECOMPRESSION MICRODISCECTOMY 2 LEVELS
Anesthesia: General

## 2019-03-06 MED ORDER — LIDOCAINE HCL (CARDIAC) PF 100 MG/5ML IV SOSY
PREFILLED_SYRINGE | INTRAVENOUS | Status: DC | PRN
  Administered 2019-03-06: 100 mg via INTRAVENOUS

## 2019-03-06 MED ORDER — LIDOCAINE HCL (PF) 2 % IJ SOLN
INTRAMUSCULAR | Status: AC
  Filled 2019-03-06: qty 10

## 2019-03-06 MED ORDER — SODIUM CHLORIDE 0.9 % IV SOLN
INTRAVENOUS | Status: DC | PRN
  Administered 2019-03-06: 40 mL

## 2019-03-06 MED ORDER — DEXTROSE 5 % IV SOLN
3.0000 g | Freq: Once | INTRAVENOUS | Status: AC
  Administered 2019-03-06: 3 g via INTRAVENOUS
  Filled 2019-03-06: qty 3

## 2019-03-06 MED ORDER — BUPIVACAINE-EPINEPHRINE (PF) 0.5% -1:200000 IJ SOLN
INTRAMUSCULAR | Status: AC
  Filled 2019-03-06: qty 30

## 2019-03-06 MED ORDER — PROPOFOL 10 MG/ML IV BOLUS
INTRAVENOUS | Status: DC | PRN
  Administered 2019-03-06: 180 mg via INTRAVENOUS

## 2019-03-06 MED ORDER — FENTANYL CITRATE (PF) 100 MCG/2ML IJ SOLN
INTRAMUSCULAR | Status: DC | PRN
  Administered 2019-03-06: 100 ug via INTRAVENOUS

## 2019-03-06 MED ORDER — MENTHOL 3 MG MT LOZG
1.0000 | LOZENGE | OROMUCOSAL | Status: DC | PRN
  Filled 2019-03-06: qty 9

## 2019-03-06 MED ORDER — IVERMECTIN 1 % EX CREA: 1.0000 "application " | TOPICAL_CREAM | Freq: Every day | CUTANEOUS | Status: DC | PRN

## 2019-03-06 MED ORDER — INSULIN ASPART 100 UNIT/ML ~~LOC~~ SOLN
0.0000 [IU] | Freq: Three times a day (TID) | SUBCUTANEOUS | Status: DC
  Administered 2019-03-07: 09:00:00 3 [IU] via SUBCUTANEOUS
  Filled 2019-03-06: qty 1

## 2019-03-06 MED ORDER — METOPROLOL SUCCINATE ER 50 MG PO TB24: 50.0000 mg | ORAL_TABLET | Freq: Every day | ORAL | Status: DC

## 2019-03-06 MED ORDER — PROPOFOL 500 MG/50ML IV EMUL
INTRAVENOUS | Status: AC
  Filled 2019-03-06: qty 50

## 2019-03-06 MED ORDER — HYDROMORPHONE HCL 1 MG/ML IJ SOLN: 0.5000 mg | INTRAMUSCULAR | Status: DC | PRN

## 2019-03-06 MED ORDER — FAMOTIDINE 20 MG PO TABS
ORAL_TABLET | ORAL | Status: AC
  Administered 2019-03-06: 10:00:00
  Filled 2019-03-06: qty 1

## 2019-03-06 MED ORDER — BUPIVACAINE LIPOSOME 1.3 % IJ SUSP
INTRAMUSCULAR | Status: AC
  Filled 2019-03-06: qty 20

## 2019-03-06 MED ORDER — MIDAZOLAM HCL 2 MG/2ML IJ SOLN
INTRAMUSCULAR | Status: AC
  Filled 2019-03-06: qty 2

## 2019-03-06 MED ORDER — SODIUM CHLORIDE 0.9 % IV SOLN
INTRAVENOUS | Status: DC
  Administered 2019-03-06: 10:00:00 via INTRAVENOUS

## 2019-03-06 MED ORDER — METHYLPREDNISOLONE ACETATE 40 MG/ML IJ SUSP
INTRAMUSCULAR | Status: DC | PRN
  Administered 2019-03-06: 40 mg

## 2019-03-06 MED ORDER — CANAGLIFLOZIN-METFORMIN HCL ER 150-1000 MG PO TB24: 2.0000 | ORAL_TABLET | Freq: Every day | ORAL | Status: DC

## 2019-03-06 MED ORDER — SEMAGLUTIDE(0.25 OR 0.5MG/DOS) 2 MG/1.5ML ~~LOC~~ SOPN: 0.5000 mg | PEN_INJECTOR | SUBCUTANEOUS | Status: DC

## 2019-03-06 MED ORDER — CEFAZOLIN SODIUM-DEXTROSE 2-4 GM/100ML-% IV SOLN
INTRAVENOUS | Status: AC
  Filled 2019-03-06: qty 100

## 2019-03-06 MED ORDER — SUCCINYLCHOLINE CHLORIDE 20 MG/ML IJ SOLN
INTRAMUSCULAR | Status: DC | PRN
  Administered 2019-03-06: 200 mg via INTRAVENOUS

## 2019-03-06 MED ORDER — FAMOTIDINE 20 MG PO TABS: 20.0000 mg | ORAL_TABLET | Freq: Once | ORAL | Status: DC

## 2019-03-06 MED ORDER — SODIUM CHLORIDE 0.9 % IV SOLN: 250.0000 mL | INTRAVENOUS | Status: DC

## 2019-03-06 MED ORDER — DEXAMETHASONE SODIUM PHOSPHATE 10 MG/ML IJ SOLN
INTRAMUSCULAR | Status: AC
  Filled 2019-03-06: qty 1

## 2019-03-06 MED ORDER — SODIUM CHLORIDE FLUSH 0.9 % IV SOLN
INTRAVENOUS | Status: AC
  Filled 2019-03-06: qty 30

## 2019-03-06 MED ORDER — PROPOFOL 10 MG/ML IV BOLUS
INTRAVENOUS | Status: AC
  Filled 2019-03-06: qty 20

## 2019-03-06 MED ORDER — ONDANSETRON HCL 4 MG/2ML IJ SOLN
INTRAMUSCULAR | Status: DC | PRN
  Administered 2019-03-06: 4 mg via INTRAVENOUS

## 2019-03-06 MED ORDER — FLUTICASONE PROPIONATE 50 MCG/ACT NA SUSP
2.0000 | Freq: Every day | NASAL | Status: DC
  Administered 2019-03-06: 21:00:00 2 via NASAL
  Filled 2019-03-06: qty 16

## 2019-03-06 MED ORDER — METHOCARBAMOL 1000 MG/10ML IJ SOLN
500.0000 mg | Freq: Four times a day (QID) | INTRAVENOUS | Status: DC
  Filled 2019-03-06: qty 5

## 2019-03-06 MED ORDER — METHOCARBAMOL 500 MG PO TABS
500.0000 mg | ORAL_TABLET | Freq: Four times a day (QID) | ORAL | Status: DC
  Administered 2019-03-06 – 2019-03-07 (×2): 500 mg via ORAL
  Filled 2019-03-06 (×6): qty 1

## 2019-03-06 MED ORDER — REMIFENTANIL HCL 1 MG IV SOLR
INTRAVENOUS | Status: AC
  Filled 2019-03-06: qty 1000

## 2019-03-06 MED ORDER — PROPOFOL 500 MG/50ML IV EMUL
INTRAVENOUS | Status: DC | PRN
  Administered 2019-03-06: 125 ug/kg/min via INTRAVENOUS

## 2019-03-06 MED ORDER — SERTRALINE HCL 50 MG PO TABS
200.0000 mg | ORAL_TABLET | Freq: Every day | ORAL | Status: DC
  Administered 2019-03-06: 21:00:00 200 mg via ORAL
  Filled 2019-03-06: qty 4

## 2019-03-06 MED ORDER — SODIUM CHLORIDE 0.9% FLUSH
3.0000 mL | Freq: Two times a day (BID) | INTRAVENOUS | Status: DC
  Administered 2019-03-07: 09:00:00 3 mL via INTRAVENOUS

## 2019-03-06 MED ORDER — METHYLPREDNISOLONE ACETATE 40 MG/ML IJ SUSP
INTRAMUSCULAR | Status: AC
  Filled 2019-03-06: qty 1

## 2019-03-06 MED ORDER — SODIUM CHLORIDE 0.9% FLUSH: 3.0000 mL | INTRAVENOUS | Status: DC | PRN

## 2019-03-06 MED ORDER — SENNA 8.6 MG PO TABS
1.0000 | ORAL_TABLET | Freq: Two times a day (BID) | ORAL | Status: DC
  Administered 2019-03-06 – 2019-03-07 (×2): 8.6 mg via ORAL
  Filled 2019-03-06 (×2): qty 1

## 2019-03-06 MED ORDER — POLYETHYLENE GLYCOL 3350 17 G PO PACK: 17.0000 g | PACK | Freq: Every day | ORAL | Status: DC | PRN

## 2019-03-06 MED ORDER — BUPIVACAINE-EPINEPHRINE (PF) 0.5% -1:200000 IJ SOLN
INTRAMUSCULAR | Status: DC | PRN
  Administered 2019-03-06: 10 mL

## 2019-03-06 MED ORDER — SODIUM CHLORIDE 0.9 % IV SOLN
INTRAVENOUS | Status: DC
  Administered 2019-03-06: 19:00:00 via INTRAVENOUS

## 2019-03-06 MED ORDER — THROMBIN 5000 UNITS EX SOLR
CUTANEOUS | Status: DC | PRN
  Administered 2019-03-06: 5000 [IU] via TOPICAL

## 2019-03-06 MED ORDER — PROPOFOL 10 MG/ML IV BOLUS
INTRAVENOUS | Status: AC
  Filled 2019-03-06: qty 40

## 2019-03-06 MED ORDER — BUPIVACAINE HCL (PF) 0.5 % IJ SOLN
INTRAMUSCULAR | Status: AC
  Filled 2019-03-06: qty 30

## 2019-03-06 MED ORDER — FENTANYL CITRATE (PF) 100 MCG/2ML IJ SOLN
INTRAMUSCULAR | Status: AC
  Filled 2019-03-06: qty 2

## 2019-03-06 MED ORDER — MIDAZOLAM HCL 2 MG/2ML IJ SOLN
INTRAMUSCULAR | Status: DC | PRN
  Administered 2019-03-06: 2 mg via INTRAVENOUS

## 2019-03-06 MED ORDER — ONDANSETRON HCL 4 MG PO TABS: 4.0000 mg | ORAL_TABLET | Freq: Four times a day (QID) | ORAL | Status: DC | PRN

## 2019-03-06 MED ORDER — MAGNESIUM CITRATE PO SOLN
1.0000 | Freq: Once | ORAL | Status: DC | PRN
  Filled 2019-03-06: qty 296

## 2019-03-06 MED ORDER — BISACODYL 5 MG PO TBEC: 5.0000 mg | DELAYED_RELEASE_TABLET | Freq: Every day | ORAL | Status: DC | PRN

## 2019-03-06 MED ORDER — OXYCODONE HCL 5 MG/5ML PO SOLN: 5.0000 mg | Freq: Once | ORAL | Status: DC | PRN

## 2019-03-06 MED ORDER — OXYCODONE HCL 5 MG PO TABS: 5.0000 mg | ORAL_TABLET | Freq: Once | ORAL | Status: DC | PRN

## 2019-03-06 MED ORDER — ACETAMINOPHEN 650 MG RE SUPP: 650.0000 mg | RECTAL | Status: DC | PRN

## 2019-03-06 MED ORDER — ACETAMINOPHEN 325 MG PO TABS: 650.0000 mg | ORAL_TABLET | ORAL | Status: DC | PRN

## 2019-03-06 MED ORDER — OXYCODONE HCL 5 MG PO TABS
10.0000 mg | ORAL_TABLET | ORAL | Status: DC | PRN
  Administered 2019-03-07: 07:00:00 10 mg via ORAL
  Filled 2019-03-06: qty 2

## 2019-03-06 MED ORDER — BUPIVACAINE HCL (PF) 0.5 % IJ SOLN
INTRAMUSCULAR | Status: DC | PRN
  Administered 2019-03-06: 20 mL

## 2019-03-06 MED ORDER — PIOGLITAZONE HCL 15 MG PO TABS
15.0000 mg | ORAL_TABLET | Freq: Every day | ORAL | Status: DC
  Administered 2019-03-06 – 2019-03-07 (×2): 15 mg via ORAL
  Filled 2019-03-06 (×2): qty 1

## 2019-03-06 MED ORDER — ACETAMINOPHEN 500 MG PO TABS
1000.0000 mg | ORAL_TABLET | Freq: Four times a day (QID) | ORAL | Status: DC
  Administered 2019-03-06 – 2019-03-07 (×3): 1000 mg via ORAL
  Filled 2019-03-06 (×3): qty 2

## 2019-03-06 MED ORDER — ONDANSETRON HCL 4 MG/2ML IJ SOLN
INTRAMUSCULAR | Status: AC
  Filled 2019-03-06: qty 2

## 2019-03-06 MED ORDER — SODIUM CHLORIDE 0.9 % IR SOLN
Status: DC | PRN
  Administered 2019-03-06: 1000 mL

## 2019-03-06 MED ORDER — PHENOL 1.4 % MT LIQD
1.0000 | OROMUCOSAL | Status: DC | PRN
  Filled 2019-03-06: qty 177

## 2019-03-06 MED ORDER — THROMBIN 5000 UNITS EX SOLR
CUTANEOUS | Status: AC
  Filled 2019-03-06: qty 5000

## 2019-03-06 MED ORDER — ONDANSETRON HCL 4 MG/2ML IJ SOLN: 4.0000 mg | Freq: Four times a day (QID) | INTRAMUSCULAR | Status: DC | PRN

## 2019-03-06 MED ORDER — BACITRACIN 50000 UNITS IM SOLR
INTRAMUSCULAR | Status: AC
  Filled 2019-03-06: qty 1

## 2019-03-06 MED ORDER — FENTANYL CITRATE (PF) 100 MCG/2ML IJ SOLN: 25.0000 ug | INTRAMUSCULAR | Status: DC | PRN

## 2019-03-06 MED ORDER — SODIUM CHLORIDE 0.9 % IV SOLN
INTRAVENOUS | Status: DC | PRN
  Administered 2019-03-06: .1 ug/kg/min via INTRAVENOUS

## 2019-03-06 MED ORDER — OXYCODONE HCL 5 MG PO TABS
5.0000 mg | ORAL_TABLET | ORAL | Status: DC | PRN
  Administered 2019-03-06: 5 mg via ORAL
  Filled 2019-03-06: qty 1

## 2019-03-06 MED ORDER — SUCCINYLCHOLINE CHLORIDE 20 MG/ML IJ SOLN
INTRAMUSCULAR | Status: AC
  Filled 2019-03-06: qty 1

## 2019-03-06 MED ORDER — DEXAMETHASONE SODIUM PHOSPHATE 10 MG/ML IJ SOLN
INTRAMUSCULAR | Status: DC | PRN
  Administered 2019-03-06: 10 mg via INTRAVENOUS

## 2019-03-06 SURGICAL SUPPLY — 62 items
BLADE CLIPPER SURG (BLADE) ×3 IMPLANT
BUR NEURO DRILL SOFT 3.0X3.8M (BURR) ×3 IMPLANT
CANISTER SUCT 1200ML W/VALVE (MISCELLANEOUS) ×6 IMPLANT
CHLORAPREP W/TINT 26 (MISCELLANEOUS) ×3 IMPLANT
CNTNR SPEC 2.5X3XGRAD LEK (MISCELLANEOUS) ×1
CONT SPEC 4OZ STER OR WHT (MISCELLANEOUS) ×2
CONTAINER SPEC 2.5X3XGRAD LEK (MISCELLANEOUS) ×1 IMPLANT
COUNTER NEEDLE 20/40 LG (NEEDLE) ×3 IMPLANT
COVER LIGHT HANDLE STERIS (MISCELLANEOUS) ×6 IMPLANT
COVER WAND RF STERILE (DRAPES) ×3 IMPLANT
CUP MEDICINE 2OZ PLAST GRAD ST (MISCELLANEOUS) ×3 IMPLANT
DERMABOND ADVANCED (GAUZE/BANDAGES/DRESSINGS) ×2
DERMABOND ADVANCED .7 DNX12 (GAUZE/BANDAGES/DRESSINGS) ×1 IMPLANT
DRAPE C-ARM 42X72 X-RAY (DRAPES) ×9 IMPLANT
DRAPE C-ARM XRAY 36X54 (DRAPES) ×6 IMPLANT
DRAPE LAPAROTOMY 100X77 ABD (DRAPES) ×3 IMPLANT
DRAPE MICROSCOPE SPINE 48X150 (DRAPES) ×3 IMPLANT
DRAPE POUCH INSTRU U-SHP 10X18 (DRAPES) ×3 IMPLANT
DRAPE SURG 17X11 SM STRL (DRAPES) ×12 IMPLANT
DRSG TEGADERM 2-3/8X2-3/4 SM (GAUZE/BANDAGES/DRESSINGS) ×3 IMPLANT
ELECT CAUTERY BLADE TIP 2.5 (TIP) ×3
ELECT EZSTD 165MM 6.5IN (MISCELLANEOUS) ×3
ELECT REM PT RETURN 9FT ADLT (ELECTROSURGICAL) ×3
ELECTRODE CAUTERY BLDE TIP 2.5 (TIP) ×1 IMPLANT
ELECTRODE EZSTD 165MM 6.5IN (MISCELLANEOUS) ×1 IMPLANT
ELECTRODE REM PT RTRN 9FT ADLT (ELECTROSURGICAL) ×1 IMPLANT
FRAME EYE SHIELD (PROTECTIVE WEAR) ×6 IMPLANT
GLOVE BIOGEL PI IND STRL 7.0 (GLOVE) ×1 IMPLANT
GLOVE BIOGEL PI INDICATOR 7.0 (GLOVE) ×2
GLOVE SURG SYN 7.0 (GLOVE) ×15 IMPLANT
GLOVE SURG SYN 8.5  E (GLOVE) ×8
GLOVE SURG SYN 8.5 E (GLOVE) ×4 IMPLANT
GOWN SRG XL LVL 3 NONREINFORCE (GOWNS) ×1 IMPLANT
GOWN STRL NON-REIN TWL XL LVL3 (GOWNS) ×2
GOWN STRL REUS W/TWL MED LVL3 (GOWN DISPOSABLE) ×3 IMPLANT
GRADUATE 1200CC STRL 31836 (MISCELLANEOUS) ×3 IMPLANT
HEMOVAC 400CC 10FR (MISCELLANEOUS) ×3 IMPLANT
KIT SPINAL PRONEVIEW (KITS) ×3 IMPLANT
KNIFE BAYONET SHORT DISCETOMY (MISCELLANEOUS) IMPLANT
MARKER SKIN DUAL TIP RULER LAB (MISCELLANEOUS) ×3 IMPLANT
NDL SAFETY ECLIPSE 18X1.5 (NEEDLE) ×1 IMPLANT
NEEDLE HYPO 18GX1.5 SHARP (NEEDLE) ×2
NEEDLE HYPO 22GX1.5 SAFETY (NEEDLE) ×3 IMPLANT
NS IRRIG 1000ML POUR BTL (IV SOLUTION) ×3 IMPLANT
PACK LAMINECTOMY NEURO (CUSTOM PROCEDURE TRAY) ×3 IMPLANT
PAD ARMBOARD 7.5X6 YLW CONV (MISCELLANEOUS) ×3 IMPLANT
SPOGE SURGIFLO 8M (HEMOSTASIS) ×2
SPONGE GAUZE 2X2 8PLY STER LF (GAUZE/BANDAGES/DRESSINGS) ×1
SPONGE GAUZE 2X2 8PLY STRL LF (GAUZE/BANDAGES/DRESSINGS) ×2 IMPLANT
SPONGE SURGIFLO 8M (HEMOSTASIS) ×1 IMPLANT
SUT DVC VLOC 3-0 CL 6 P-12 (SUTURE) ×3 IMPLANT
SUT VIC AB 0 CT1 27 (SUTURE) ×2
SUT VIC AB 0 CT1 27XCR 8 STRN (SUTURE) ×1 IMPLANT
SUT VIC AB 2-0 CT1 18 (SUTURE) ×3 IMPLANT
SYR 30ML LL (SYRINGE) ×6 IMPLANT
SYR 3ML LL SCALE MARK (SYRINGE) ×6 IMPLANT
TOWEL OR 17X26 4PK STRL BLUE (TOWEL DISPOSABLE) ×9 IMPLANT
TUBE MATRX SPINL 18MM 7CM DISP (INSTRUMENTS) ×2
TUBE METRX SPINAL 18X7 DISP (INSTRUMENTS) ×1 IMPLANT
TUBING CONNECTING 10 (TUBING) ×2 IMPLANT
TUBING CONNECTING 10' (TUBING) ×1
WATER STERILE IRR 1000ML POUR (IV SOLUTION) ×6 IMPLANT

## 2019-03-06 NOTE — H&P (Signed)
History of Present Illness: 03/06/2019 Cory Perez presents today with worsening symptoms of claudication including development of difficulty initiating his urine stream.  He has failed conservative management.  12/27/2018  Cory Perez returns clinic today after failing physical therapy and injection therapy. He continues to have pain as described below that is worst in an L5 distribution.  He just had a physical therapy on December 24, 2018. He has had 2 injections on January 6 and December 24, 2018 with bilateral S1 injections.  He does report some difficulty completely emptying his bladder. He does some saddle anesthesia, but is able to urinate and control his bowels. He describes classic symptoms of neurogenic claudication including pain down his legs that occurs when he stands or walks more than a short distance that then stops when he sits down. He has been bending over a grocery cart. He is unable to perform most of his normal chores at home including yard work. He is very frustrated by his current inability to perform physical activity.  10/25/2018 from [place] note: Cory Perez is a 53 y.o. male non-smoker who presents with the chief complaint of back pain and leg pain. Patient states back pain is been going on for approximately 3 years with progressive worsening. Described as constant but variable burning, throbbing, intense pain diffusely through the low lumbar spine. Symptoms are aggravated with standing, walking, twisting, lifting, changing positions. Relieved with bending forward, sitting, rest, medications. Received injections with Dr. Sharlet Salina about 2 years ago that provided some relief but not as much as was expected. Did not try physical therapy since presentation of back complaints. Lower extremity symptoms began approximately 1 to 1-1/2 years ago. Symptoms initially began in the arch of the feet and then he began to notice bilateral buttock achiness that became constant but  variable in severity. Symptoms continue to progress and complains of "soreness throughout the lateral aspect of bilateral lower extremities, cramping in bilateral calves and posterior thighs mainly when arising from sleep or getting up from a prolonged seated position. Also complains of numbness, tingling, weakness, and balance issues. Symptoms are significantly limiting daily activities. Pain currently rated 4/10; at best 3/10; at worst 6-7/10. Denies bladder/bowel dysfunction, saddle paresthesia, recent falls/trauma, upper extremity symptoms including pain/numbness/tingling/weakness.  Received a total of 2 epidural steroid injections approximately 2 years ago He has not received physical therapy for any of his complaints. Denies previous spinal surgery  Review of Systems:  A 10 point review of systems is negative, except for the pertinent positives and negatives detailed in the HPI.  Past Medical History: Past Medical History:  Diagnosis Date  . Abnormal results of liver function studies  . Aldosterone excess (Conn syndrome) (CMS-HCC)  . Anxiety  . Arrhythmia  . Depression  . DJD (degenerative joint disease)  . DM2 (diabetes mellitus, type 2) (CMS-HCC)  . History of chicken pox  . History of syncope  . HLD (hyperlipidemia)  . HTN (hypertension)  . Hx of atrioventricular node ablation  . Hypersomnia with sleep apnea, unspecified  . Lumbar spinal stenosis  . Other and unspecified angina pectoris (CMS-HCC)  . Psoriasis  . Seasonal allergies  Chronic.  Marland Kitchen Seborrheic dermatitis  . Sleep apnea   Past Surgical History: Past Surgical History:  Procedure Laterality Date  . ADRENALECTOMY Right 02/2016  . COLONOSCOPY 12/05/2003,09/05/2002  Dr. Chauncey Cruel. [place] @ [place] - Adenomatous Polyps, [facility] (Sister)  . COLONOSCOPY 04/21/2017  Dr. Dorthy Cooler @ [place] - Adenomatous Polyps, [facility] (Sister): CBF 04/2020  .  COLOSTOMY  . JOINT REPLACEMENT Left 02/12/2014  Left knee medial MAKOplasty.  Marland Kitchen UPP surgery  on throat  . Vasectomy   Allergies: Allergies as of 12/27/2018 - Reviewed 12/27/2018  Allergen Reaction Noted  . Nizoral [ketoconazole] Hives 02/24/2014   Medications: Outpatient Encounter Medications as of 12/27/2018  Medication Sig Dispense Refill  . acetaminophen (TYLENOL) 500 mg capsule Take 500 mg by mouth every 4 (four) hours as needed for Fever or Pain.  . baclofen (LIORESAL) 10 MG tablet Take 1 tablet by mouth 3 (three) times daily as needed  . BD INSULIN PEN NEEDLE UF MINI 31 gauge x 3/16" needle USE AS DIRECTED 100 each 1  . blood glucose diagnostic (CONTOUR TEST STRIPS) test strip Use 1 each 2 (two) times daily. Use as instructed. 100 each 3  . canagliflozin-metformin (INVOKAMET XR) 150-1,000 mg TBph Take 2 tablets by mouth once daily 60 each 12  . cyanocobalamin (VITAMIN B12) 1000 MCG tablet Take 1,000 mcg by mouth once daily.  . fexofenadine (ALLEGRA) 180 MG tablet Take 180 mg by mouth once daily.  . fluticasone propionate (FLONASE) 50 mcg/actuation nasal spray 2 SPRAYS IN EACH NOSTRIL EVERY DAY 16 g 2  . meloxicam (MOBIC) 15 MG tablet Take 1 tablet (15 mg total) by mouth once daily 30 tablet 11  . metoprolol succinate (TOPROL-XL) 50 MG XL tablet Take 1 tablet (50 mg total) by mouth once daily 30 tablet 5  . SECUKINUMAB SUBQ Inject 300 mg subcutaneously monthly for psoriasis  . semaglutide (OZEMPIC) 0.25 mg or 0.5 mg(2 mg/1.5 mL) PnIj Inject 0.5 mg subcutaneously every 7 (seven) days 1.5 mL 12  . sertraline (ZOLOFT) 100 MG tablet TAKE 2 TABLETS BY MOUTH DAILY 60 tablet 5   No facility-administered encounter medications on file as of 12/27/2018.   Social History: Social History   Tobacco Use  . Smoking status: Never Smoker  . Smokeless tobacco: Never Used  . Tobacco comment: only smoked for a week  Substance Use Topics  . Alcohol use: Yes  Alcohol/week: 0.0 standard drinks  Comment: rarely  . Drug use: No   Family Medical History: Family History  Problem Relation Age  of Onset  . Diabetes Mother  . High blood pressure (Hypertension) Mother  . Myocardial Infarction (Heart attack) Mother 62  CABGx2  . Diabetes Father  . High blood pressure (Hypertension) Father  . Psoriasis Father  . Colon cancer Sister  . Diabetes Brother  . Psoriasis Brother  . Anesthesia problems Neg Hx  . Malignant hypertension Neg Hx  . Malignant hyperthermia Neg Hx   Physical Examination: Vitals:   Today's Vitals   03/06/19 0943  BP: (!) 157/80  Pulse: 65  Resp: 16  Temp: 97.9 F (36.6 C)  TempSrc: Temporal  SpO2: 100%  Height: 6\' 6"  (1.981 m)  PainSc: 0-No pain   Body mass index is 33.29 kg/m.   General: Patient is well developed, well nourished, calm, collected, and in no apparent distress. Attention to examination is appropriate.  Psychiatric: Patient is non-anxious.  Head: Pupils equal, round, and reactive to light.  ENT: Oral mucosa appears well hydrated.  Neck: Supple. Full range of motion.  Respiratory: Patient is breathing without any difficulty.  Extremities: No edema.  Vascular: Palpable dorsal pedal pulses.  Skin: On exposed skin, there are no abnormal skin lesions.  Heart sounds normal no MRG. Chest Clear to Auscultation Bilaterally.   NEUROLOGICAL:   Awake, alert, oriented to person, place, and time. Speech is clear and  fluent. Fund of knowledge is appropriate.   Cranial Nerves: Pupils equal round and reactive to light. Facial tone is symmetric. Facial sensation is symmetric. Shoulder shrug is symmetric. Tongue protrusion is midline. There is no pronator drift.  ROM of spine: full. Palpation of spine: non tender.   Strength: Side Biceps Triceps Deltoid Interossei Grip Wrist Ext. Wrist Flex.  R 5 5 5 5 5 5 5   L 5 5 5 5 5 5 5    Side Iliopsoas Quads Hamstring PF DF EHL  R 5 5 5 5 5  4+  L 5 5 5 5 5 5    Reflexes are 2+ and symmetric at the biceps, triceps, brachioradialis, patella and achilles. Hoffman's is absent. Clonus is not  present. Toes are down-going.  Bilateral upper and lower extremity sensation is intact to light touch.  Gait is untested  Medical Decision Making  Imaging: MRI L spine 11/19/2018 IMPRESSION: 1. No acute osseous abnormality. Mild progression of L4-5 grade 1 anterolisthesis. 2. Progression of lumbar spondylosis greatest at L3-4 and L4-5. 3. Severe L3-4 and L4-5 spinal canal stenosis. Crowding of cauda equina above L3-4 level likely due to high-grade stenosis. 4. Moderate L3-4, moderate L4-5, and mild L5-S1 foraminal stenosis.  Electronically Signed By: Kristine Garbe M.D. On: 11/19/2018 22:43  I have personally reviewed the images and agree with the above interpretation.  Assessment and Plan: Mr. Rewerts is a pleasant 53 y.o. male with lumbar stenosis causing neurogenic claudication. He has worsening symptoms for cauda equina syndrome, though he does not have the full syndrome.   Plan for L3-5 lumbar decompression.  Lanna Labella K. Izora Ribas MD, [place] Dept. of Neurosurgery

## 2019-03-06 NOTE — Anesthesia Procedure Notes (Signed)
Procedure Name: Intubation Performed by: Jonna Clark, CRNA Pre-anesthesia Checklist: Patient identified, Patient being monitored, Timeout performed, Emergency Drugs available and Suction available Patient Re-evaluated:Patient Re-evaluated prior to induction Oxygen Delivery Method: Circle system utilized Preoxygenation: Pre-oxygenation with 100% oxygen Induction Type: IV induction Ventilation: Mask ventilation without difficulty Laryngoscope Size: McGraph and 4 Grade View: Grade I Tube type: Oral Tube size: 7.5 mm Number of attempts: 1 Airway Equipment and Method: Stylet and Video-laryngoscopy Placement Confirmation: ETT inserted through vocal cords under direct vision,  positive ETCO2 and breath sounds checked- equal and bilateral Secured at: 23 cm Tube secured with: Tape Dental Injury: Teeth and Oropharynx as per pre-operative assessment

## 2019-03-06 NOTE — Progress Notes (Addendum)
Ch met w/ pt in pre-op. Pt had a positive affect but was anxious to be taken back. Pt was concerned that his surgery was delayed a few hours. Pt shared that he has been dealing w/ the back pain for over 3 years and the pain was causing numbness in his leg(s). Ch provided a compassionate presence and hoped he recovers well.   [place] spoke briefly w/ pt wife. Wife shared that pt has been having pain and incontinent issues and hopeful the surgery will resolve the issue. Wife appreciated call.     03/06/19 1100  Clinical Encounter Type  Visited With Patient  Visit Type Psychological support;Spiritual support;Social support;Pre-op  Spiritual Encounters  Spiritual Needs Emotional;Grief support  Stress Factors  Patient Stress Factors Health changes;Major life changes  Family Stress Factors None identified

## 2019-03-06 NOTE — Anesthesia Preprocedure Evaluation (Signed)
Anesthesia Evaluation  Patient identified by MRN, date of birth, ID band Patient awake    Reviewed: Allergy & Precautions, H&P , NPO status , Patient's Chart, lab work & pertinent test results  History of Anesthesia Complications Negative for: history of anesthetic complications  Airway Mallampati: III  TM Distance: <3 FB Neck ROM: limited    Dental  (+) Chipped, Poor Dentition   Pulmonary neg shortness of breath, sleep apnea and Continuous Positive Airway Pressure Ventilation ,           Cardiovascular Exercise Tolerance: Good hypertension, (-) angina+ dysrhythmias Atrial Fibrillation      Neuro/Psych PSYCHIATRIC DISORDERS  Neuromuscular disease    GI/Hepatic negative GI ROS, Neg liver ROS,   Endo/Other  diabetes, Type 2  Renal/GU      Musculoskeletal  (+) Arthritis ,   Abdominal   Peds  Hematology negative hematology ROS (+)   Anesthesia Other Findings Past Medical History: No date: Abnormal results of liver function studies No date: Aldosterone excess (Conn syndrome) (HCC) No date: Anxiety No date: Arrhythmia 12/24/2018: Atrial flutter, paroxysmal (HCC)     Comment:  Sp ablation 2017 No date: Depression No date: DJD (degenerative joint disease) No date: DM2 (diabetes mellitus, type 2) (HCC) No date: History of chicken pox No date: History of syncope No date: HLD (hyperlipidemia) No date: HTN (hypertension) No date: Hx of atrioventricular node ablation No date: Hypersomnia with sleep apnea No date: Lumbar spinal stenosis No date: Other forms of angina pectoris (HCC)     Comment:  Other and unspecified angina pectoris No date: Psoriasis No date: Seasonal allergies No date: Seborrheic dermatitis No date: Sleep apnea  Past Surgical History: 02/20/2016 - 03/20/2016: ADRENALECTOMY; Right 12/05/2003, 09/05/2002: COLONOSCOPY     Comment:  Dr. Chauncey Cruel. [place] @ [place] - Adenomatous Polyps, [facility]   (sister) 02/12/2014: JOINT REPLACEMENT; Left     Comment:  Left knee medial MAKOplasty No date: TONSILLECTOMY No date: UPP surgery on throat No date: VASECTOMY  BMI    Body Mass Index:  33.29 kg/m      Reproductive/Obstetrics negative OB ROS                             Anesthesia Physical Anesthesia Plan  ASA: III  Anesthesia Plan: General ETT   Post-op Pain Management:    Induction: Intravenous  PONV Risk Score and Plan: Ondansetron, Dexamethasone, Midazolam and Treatment may vary due to age or medical condition  Airway Management Planned: Oral ETT and Video Laryngoscope Planned  Additional Equipment:   Intra-op Plan:   Post-operative Plan: Extubation in OR  Informed Consent: I have reviewed the patients History and Physical, chart, labs and discussed the procedure including the risks, benefits and alternatives for the proposed anesthesia with the patient or authorized representative who has indicated his/her understanding and acceptance.     Dental Advisory Given  Plan Discussed with: Anesthesiologist, CRNA and Surgeon  Anesthesia Plan Comments: (Patient consented for risks of anesthesia including but not limited to:  - adverse reactions to medications - damage to teeth, lips or other oral mucosa - sore throat or hoarseness - Damage to heart, brain, lungs or loss of life  Patient voiced understanding.)        Anesthesia Quick Evaluation

## 2019-03-06 NOTE — Op Note (Signed)
Indications: Mr. Sayres is a 53 yo male who presented with lumbar stenosis and began having worsening symptoms including cauda equina syndrome.  Due to his worsening symptoms and urinary symptoms, it was felt we should proceed with surgery.  Findings: severe stenosis L3-4 and L4-5 Preoperative Diagnosis: Lumbar Stenosis with neurogenic claudication, cauda equina syndrome Postoperative Diagnosis: same   EBL: 100 ml IVF: 800 ml Drains: 1 placed Disposition: Extubated and Stable to PACU Complications: none  No foley catheter was placed.   Preoperative Note:   Risks of surgery discussed include: infection, bleeding, stroke, coma, death, paralysis, CSF leak, nerve/spinal cord injury, numbness, tingling, weakness, complex regional pain syndrome, recurrent stenosis and/or disc herniation, vascular injury, development of instability, neck/back pain, need for further surgery, persistent symptoms, development of deformity, and the risks of anesthesia. The patient understood these risks and agreed to proceed.  Operative Note:   1. L3-5 lumbar decompression including central laminectomy and bilateral medial facetectomies including foraminotomies  The patient was then brought from the preoperative center with intravenous access established.  The patient underwent general anesthesia and endotracheal tube intubation, and was then rotated on the [place] rail top where all pressure points were appropriately padded.  The skin was then thoroughly cleansed.  Perioperative antibiotic prophylaxis was administered.  Sterile prep and drapes were then applied and a timeout was then observed.  C-arm was brought into the field under sterile conditions and under lateral visualization the L4-5 interspace was identified and marked.  The incision was marked on the right and injected with local anesthetic. Once this was complete a 3 cm incision was opened with the use of a #10 blade knife.    The metrx tubes were  sequentially advanced and confirmed in position at L4-5. An 49mm by 40mm tube was locked in place to the bed side attachment.  The microscope was then sterilely brought into the field and muscle creep was hemostased with a bipolar and resected with a pituitary rongeur.  A Bovie extender was then used to expose the spinous process and lamina.  Careful attention was placed to not violate the facet capsule. A 3 mm matchstick drill bit was then used to make a hemi-laminotomy trough until the ligamentum flavum was exposed.  This was extended to the base of the spinous process and to the contralateral side to remove all the central bone from each side.  Once this was complete and the underlying ligamentum flavum was visualized, it was dissected with a curette and resected with Kerrison rongeurs.  Extensive ligamentum hypertrophy was noted, requiring a substantial amount of time and care for removal.  The dura was identified and palpated. The kerrison rongeur was then used to remove the medial facet bilaterally until no compression was noted.  A balltip probe was used to confirm decompression of the ipsilateral L5 nerve root.  Additional attention was paid to completion of the contralateral L4-5 foraminotomy until the contralateral traversing nerve root was completely free.  Once this was complete, L4-5 central decompression including medial facetectomy and foraminotomy was confirmed and decompression on both sides was confirmed. No CSF leak was noted.  A Depo-Medrol soaked Gelfoam pledget was placed in the defect.  The wound was copiously irrigated. The tube system was then removed under microscopic visualization and hemostasis was obtained with a bipolar.    After performing the decompression at L4-5, the metrx tubes were sequentially advanced and confirmed in position at L3-4. An 48mm by 38mm tube was locked in place to the  bed side attachment.  Fluoroscopy was then removed from the field.  The microscope was then  sterilely brought into the field and muscle creep was hemostased with a bipolar and resected with a pituitary rongeur.  A Bovie extender was then used to expose the spinous process and lamina.  Careful attention was placed to not violate the facet capsule. A 3 mm matchstick drill bit was then used to make a hemi-laminotomy trough until the ligamentum flavum was exposed.  This was extended to the base of the spinous process and to the contralateral side to remove all the central bone from each side.  Once this was complete and the underlying ligamentum flavum was visualized, it was dissected with a curette and resected with Kerrison rongeurs.  Extensive ligamentum hypertrophy was noted, requiring a substantial amount of time and care for removal.  The dura was identified and palpated. The kerrison rongeur was then used to remove the medial facet bilaterally until no compression was noted.  A balltip probe was used to confirm decompression of the ipsilateral L4 nerve root.  Additional attention was paid to completion of the contralateral foraminotomy until the contralateral L4 nerve root was completely free.  Once this was complete, L3-4 central decompression including medial facetectomy and foraminotomy was confirmed and decompression on both sides was confirmed. No CSF leak was noted.  A Depo-Medrol soaked Gelfoam pledget was placed in the defect.  The wound was copiously irrigated. The tube system was then removed under microscopic visualization and hemostasis was obtained with a bipolar.    Due to continued venous oozing, a drain was placed.   The fascial layer was reapproximated with the use of a 0 Vicryl suture.  Subcutaneous tissue layer was reapproximated using 2-0 Vicryl suture.  3-0 monocryl was placed in subcuticular fashion. The skin was then cleansed and Dermabond was used to close the skin opening.  Patient was then rotated back to the preoperative bed awakened from anesthesia and taken to  recovery all counts are correct in this case.  I performed the entire procedure with the assistance of Marin Olp PA as an Pensions consultant.  Bearl Talarico K. Izora Ribas MD

## 2019-03-06 NOTE — Progress Notes (Signed)
Procedure: L3-L5 lumbar decompression Procedure date: 03/06/2019 Diagnosis: lumbar stenosis with Cauda equina syndrome  History: Cory Perez is s/p L3-L5 lumbar decompression. POD0: Tolerated procedure well. Seen post operatively still disoriented from anesthesia but able to answer questions and obey commands. Back pain 2/10. Denies any lower extremity pain except for achy pain in right great toe. Denies any new pain/numbness/tinging in lower extremities.   Physical Exam: Vitals:   03/06/19 0943  BP: (!) 157/80  Pulse: 65  Resp: 16  Temp: 97.9 F (36.6 C)  SpO2: 100%    Strength:5/5 throughout lower extremities Sensation: intact and symmetric throughout lower extremities Skin: glue intact  Data:  No results for input(s): NA, K, CL, CO2, BUN, CREATININE, LABGLOM, GLUCOSE, CALCIUM in the last 168 hours. No results for input(s): AST, ALT, ALKPHOS in the last 168 hours.  Invalid input(s): TBILI   No results for input(s): WBC, HGB, HCT, PLT in the last 168 hours. No results for input(s): APTT, INR in the last 168 hours.       Other tests/results: No imaging reviewed  Assessment/Plan:  Cory Perez is POD0 s/p L3-5 lumbar decompression. Recovering well. Will continue to observe and monitor drain output.   - monitor drain output - mobilize - pain control - DVT prophylaxis   Marin Olp PA-C Department of Neurosurgery

## 2019-03-06 NOTE — Transfer of Care (Signed)
Immediate Anesthesia Transfer of Care Note  Patient: Cory Perez  Procedure(s) Performed: LUMBAR LAMINECTOMY/DECOMPRESSION MICRODISCECTOMY 2 LEVELS L3-4; L4-5 (N/A )  Patient Location: PACU  Anesthesia Type:General  Level of Consciousness: sedated  Airway & Oxygen Therapy: Patient Spontanous Breathing and Patient connected to face mask oxygen  Post-op Assessment: Report given to RN and Post -op Vital signs reviewed and stable  Post vital signs: Reviewed and stable  Last Vitals:  Vitals Value Taken Time  BP 135/77 03/06/2019  3:55 PM  Temp    Pulse 64 03/06/2019  3:55 PM  Resp 17 03/06/2019  3:55 PM  SpO2 100 % 03/06/2019  3:55 PM  Vitals shown include unvalidated device data.  Last Pain:  Vitals:   03/06/19 1555  TempSrc:   PainSc: 0-No pain         Complications: No apparent anesthesia complications

## 2019-03-06 NOTE — Anesthesia Post-op Follow-up Note (Signed)
Anesthesia QCDR form completed.        

## 2019-03-07 ENCOUNTER — Encounter: Payer: Self-pay | Admitting: Neurosurgery

## 2019-03-07 DIAGNOSIS — M48062 Spinal stenosis, lumbar region with neurogenic claudication: Secondary | ICD-10-CM | POA: Diagnosis not present

## 2019-03-07 LAB — GLUCOSE, CAPILLARY: Glucose-Capillary: 161 mg/dL — ABNORMAL HIGH (ref 70–99)

## 2019-03-07 MED ORDER — METHOCARBAMOL 500 MG PO TABS: 500.0000 mg | ORAL_TABLET | Freq: Three times a day (TID) | ORAL | 0 refills | Status: DC

## 2019-03-07 MED ORDER — OXYCODONE HCL 5 MG PO TABS: 5.0000 mg | ORAL_TABLET | ORAL | 0 refills | Status: DC | PRN

## 2019-03-07 NOTE — Progress Notes (Signed)
Procedure: L3-L5 lumbar decompression Procedure date: 03/06/2019 Diagnosis: lumbar stenosis with Cauda equina syndrome  History: Cory Perez is s/p L3-L5 lumbar decompression.  POD0: Tolerated procedure well. Seen post operatively still disoriented from anesthesia but able to answer questions and obey commands. Back pain 2/10. Denies any lower extremity pain except for achy pain in right great toe. Denies any new pain/numbness/tinging in lower extremities.   POD1: Patient continues to recover well.  Back pain currently rated 2-3/10.  Feels that lower extremities are not is tight and stiff as they were prior to surgery.  Also complains numbness is improving.  Continues to have tingling in the arches of feet bilaterally.  He has been able to ambulate to restroom, void, eat without issue.  Denies any new numbness/pain/tingling/weakness in lower extremities. Drain output following surgery till this morning 125.  Physical Exam: Vitals:   03/07/19 0422 03/07/19 0811  BP: (!) 114/58 124/65  Pulse: (!) 58 (!) 56  Resp: 15   Temp: 97.8 F (36.6 C) 97.7 F (36.5 C)  SpO2: 95% 96%    Strength:5/5 throughout lower extremities Sensation: intact and symmetric throughout lower extremities Skin: glue intact, no bleeding.  Drain intact with no active bleeding at drain insertion site. Data:  No results for input(s): NA, K, CL, CO2, BUN, CREATININE, LABGLOM, GLUCOSE, CALCIUM in the last 168 hours. No results for input(s): AST, ALT, ALKPHOS in the last 168 hours.  Invalid input(s): TBILI   No results for input(s): WBC, HGB, HCT, PLT in the last 168 hours. No results for input(s): APTT, INR in the last 168 hours.       Other tests/results: No imaging reviewed  Assessment/Plan:  Cory Perez is POD1 s/p L3-5 lumbar decompression. Recovering well and symptoms that were present prior to surgery are improving.  We will continue to monitor drain output and reevaluate later this afternoon to  determine if it is appropriate to discontinue drain.  - monitor drain output - mobilize - pain control - DVT prophylaxis   Marin Olp PA-C Department of Neurosurgery

## 2019-03-07 NOTE — Discharge Instructions (Signed)

## 2019-03-07 NOTE — Progress Notes (Signed)
Ch f/u w/ pt once she noticed pt was admitted post-op. Pt was in good spirits when ch visited. Pt shared that he had some drainage that needed to be removed before being d/c. Ch provided compassionate presence and assisted w/ finding a NT for pt to use bathroom. Pt was appreciative of visit. NO further needs at this time.    03/07/19 1000  Clinical Encounter Type  Visited With Patient  Visit Type Follow-up;Psychological support;Spiritual support;Social support;Post-op  Spiritual Encounters  Spiritual Needs Emotional;Grief support  Stress Factors  Patient Stress Factors Health changes;Major life changes  Family Stress Factors None identified

## 2019-03-07 NOTE — Discharge Summary (Signed)
Procedure: L3-L5 lumbar decompression Procedure date: 03/06/2019 Diagnosis: lumbar stenosis with Cauda equina syndrome  History: Cory Perez is s/p L3-L5 lumbar decompression.  POD0: Tolerated procedure well. Seen post operatively still disoriented from anesthesia but able to answer questions and obey commands. Back pain 2/10. Denies any lower extremity pain except for achy pain in right great toe. Denies any new pain/numbness/tinging in lower extremities.   POD1: Patient continues to recover well.  Back pain currently rated 2-3/10.  Feels that lower extremities are not is tight and stiff as they were prior to surgery.  Also complains numbness is improving.  Continues to have tingling in the arches of feet bilaterally.  He has been able to ambulate to restroom, void, eat without issue.  Denies any new numbness/pain/tingling/weakness in lower extremities. Drain output following surgery till this morning 125.  Drain output reassessed in afternoon by Dr. Izora Ribas. Drain pulled without issue.   Physical Exam: Vitals:   03/07/19 0422 03/07/19 0811  BP: (!) 114/58 124/65  Pulse: (!) 58 (!) 56  Resp: 15   Temp: 97.8 F (36.6 C) 97.7 F (36.5 C)  SpO2: 95% 96%    Strength:5/5 throughout lower extremities Sensation: intact and symmetric throughout lower extremities Skin: glue intact, no bleeding. No active bleeding at drain insertion site.  Data:  No results for input(s): NA, K, CL, CO2, BUN, CREATININE, LABGLOM, GLUCOSE, CALCIUM in the last 168 hours. No results for input(s): AST, ALT, ALKPHOS in the last 168 hours.  Invalid input(s): TBILI   No results for input(s): WBC, HGB, HCT, PLT in the last 168 hours. No results for input(s): APTT, INR in the last 168 hours.       Other tests/results: No imaging reviewed  Assessment/Plan:  Cory Perez is POD1 s/p L3-5 lumbar decompression. Recovering well and symptoms that were present prior to surgery are improving.  Discussed wound  care and activity restrictions. He is scheduled to follow up in clinic in approximately 2 weeks to monitor progress. Advised to contact office if questions or concerns arise.    Marin Olp PA-C Department of Neurosurgery

## 2019-03-07 NOTE — Progress Notes (Signed)
Discharge summary reviewed with verbal understanding. Rxs and cpap given upon discharge. Answered all questions

## 2019-03-08 NOTE — Anesthesia Postprocedure Evaluation (Signed)
Anesthesia Post Note  Patient: Cory Perez  Procedure(s) Performed: LUMBAR LAMINECTOMY/DECOMPRESSION MICRODISCECTOMY 2 LEVELS L3-4; L4-5 (N/A )  Patient location during evaluation: PACU Anesthesia Type: General Level of consciousness: awake and alert Pain management: pain level controlled Vital Signs Assessment: post-procedure vital signs reviewed and stable Respiratory status: spontaneous breathing, nonlabored ventilation and respiratory function stable Cardiovascular status: blood pressure returned to baseline and stable Postop Assessment: no apparent nausea or vomiting Anesthetic complications: no     Last Vitals:  Vitals:   03/07/19 0811 03/07/19 1225  BP: 124/65   Pulse: (!) 56 68  Resp:  18  Temp: 36.5 C 37 C  SpO2: 96% 97%    Last Pain:  Vitals:   03/07/19 1225  TempSrc: Axillary  PainSc:                  [name]

## 2019-04-27 IMAGING — RF DG C-ARM 61-120 MIN
1 series · 4 of 4 positions shown · non-contrast
Comparison: MRI 11/19/2018

CLINICAL DATA: Elective surgery.

EXAM:
LUMBAR SPINE - 2-3 VIEW; DG C-ARM 61-120 MIN

[Series 1: unknown protocol · 0.14mm/px · 4 of 4 slices shown]
[im 1/4]
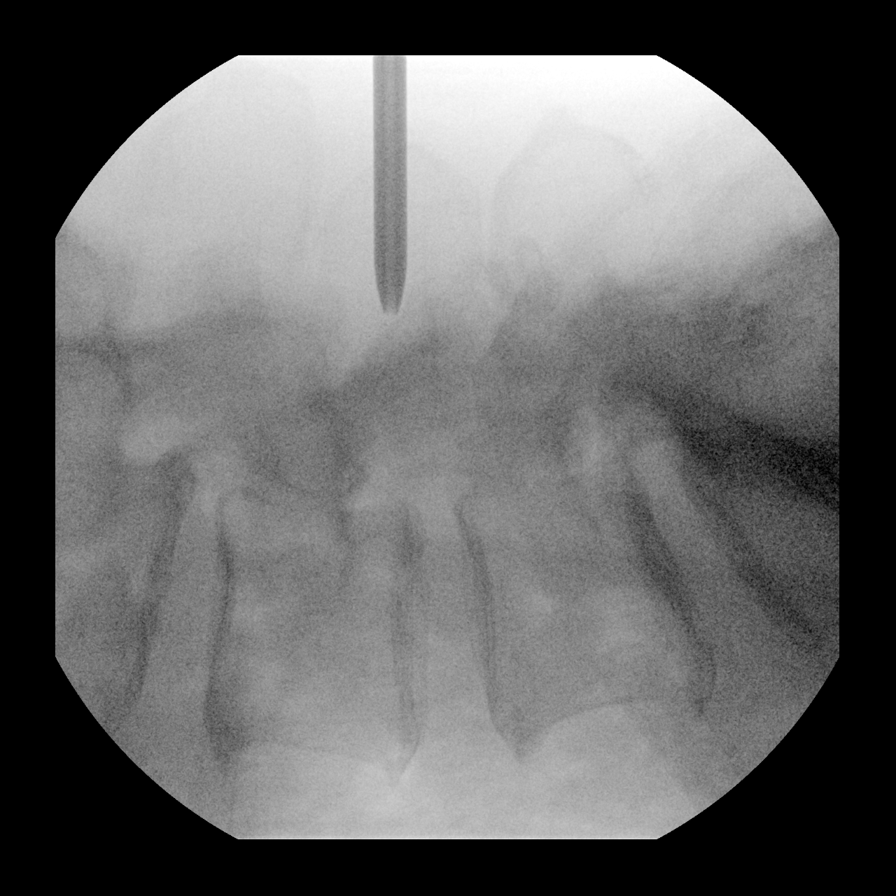
[im 2/4]
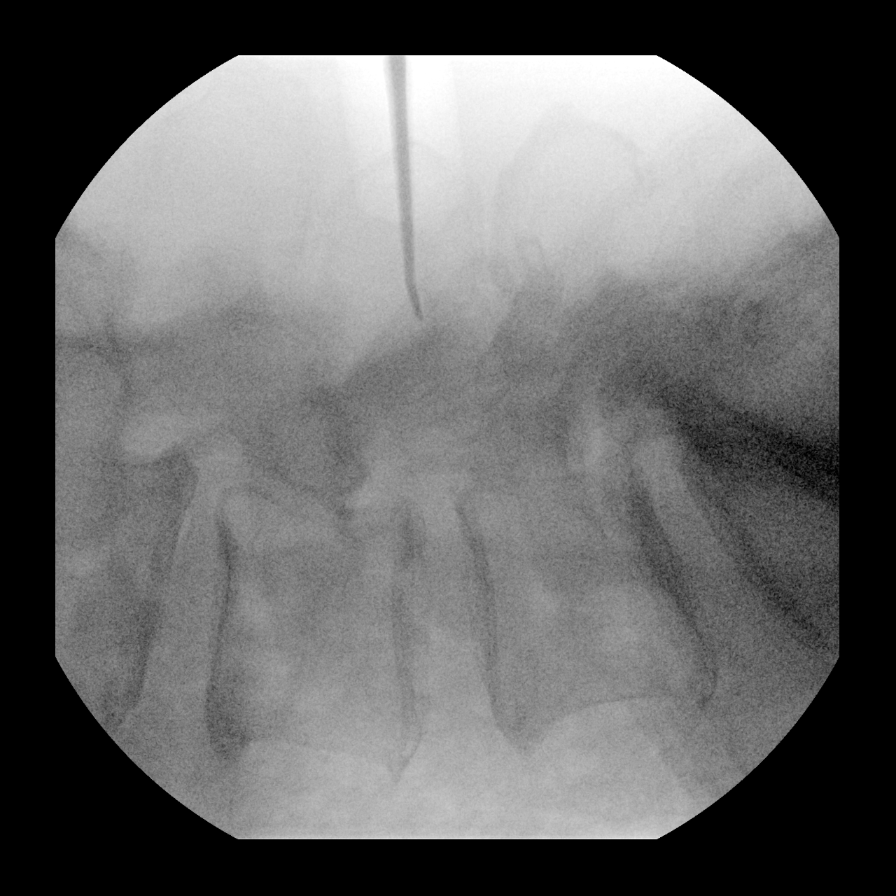
[im 3/4]
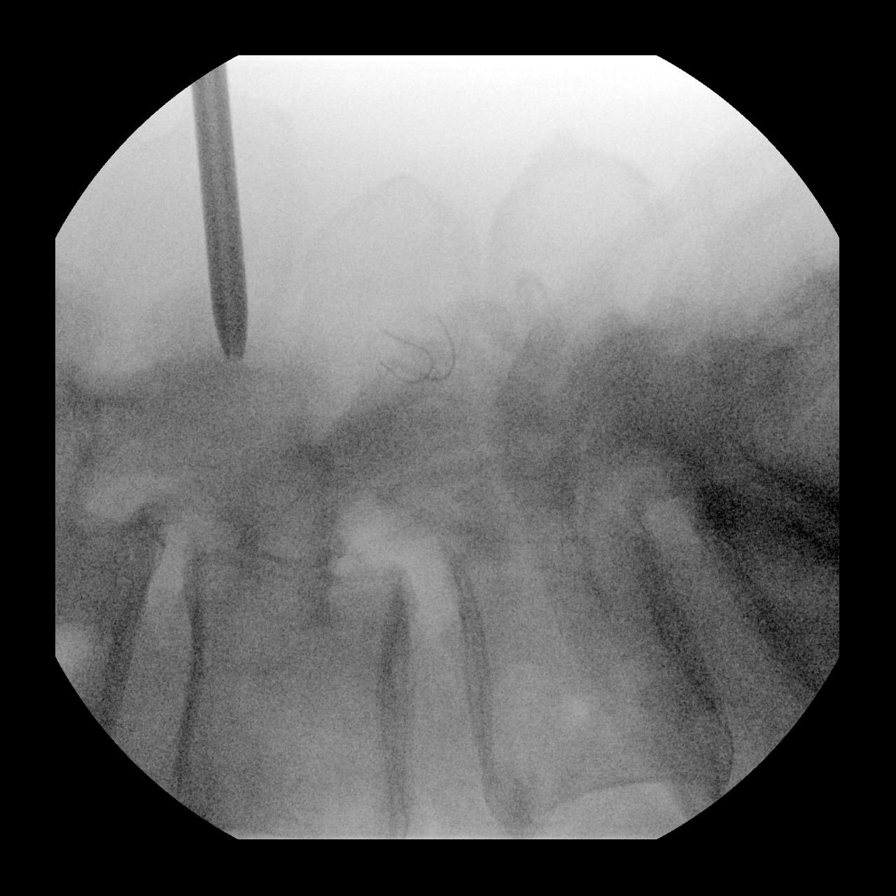
[im 4/4]
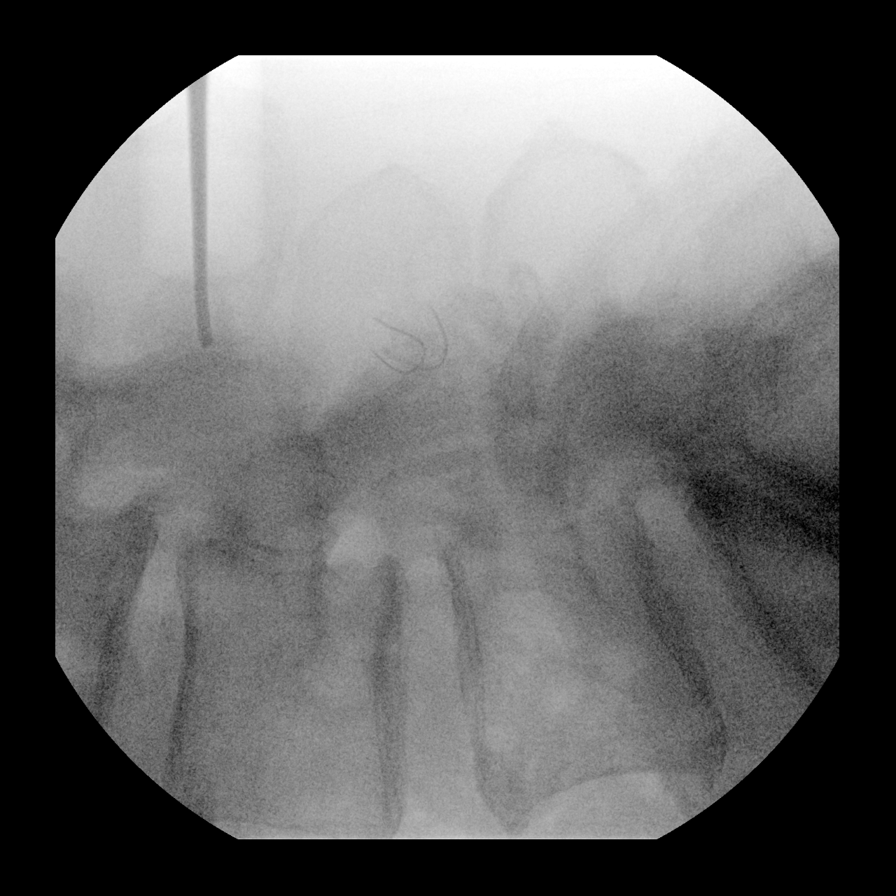

[4 of 4 positions shown; findings below may reference images not displayed]

FINDINGS: Examination demonstrates mild spondylosis of the lumbar spine.
Vertebral body heights are maintained. The first image demonstrates
a surgical instrument with tip projecting over the L4 spinous
process at the level of the L4-5 disc space. Subsequent images
demonstrate a surgical instrument with tip projecting over the L3
spinous process at the level of the L3-4 disc space.
IMPRESSION: Intraoperative images of the lower lumbar spine as described.

## 2019-12-09 DIAGNOSIS — M79601 Pain in right arm: Secondary | ICD-10-CM | POA: Insufficient documentation

## 2019-12-09 DIAGNOSIS — M542 Cervicalgia: Secondary | ICD-10-CM | POA: Insufficient documentation

## 2019-12-09 DIAGNOSIS — R202 Paresthesia of skin: Secondary | ICD-10-CM | POA: Insufficient documentation

## 2020-02-03 DIAGNOSIS — M5412 Radiculopathy, cervical region: Secondary | ICD-10-CM | POA: Insufficient documentation

## 2020-02-14 ENCOUNTER — Ambulatory Visit: Payer: PRIVATE HEALTH INSURANCE | Attending: Internal Medicine

## 2020-02-14 DIAGNOSIS — Z23 Encounter for immunization: Secondary | ICD-10-CM

## 2020-02-14 NOTE — Progress Notes (Signed)
   Covid-19 Vaccination Clinic  Name:  Cory Perez    MRN: [id number] DOB: [birth year]  02/14/2020  [name] was observed post Covid-19 immunization for 15 minutes without incident. He was provided with Vaccine Information Sheet and instruction to access the V-Safe system.   [name] was instructed to call 911 with any severe reactions post vaccine: Marland Kitchen Difficulty breathing  . Swelling of face and throat  . A fast heartbeat  . A bad rash all over body  . Dizziness and weakness   Immunizations Administered    Name Date Dose VIS Date Route   Moderna COVID-19 Vaccine 02/14/2020 11:04 AM 0.5 mL 10/22/2019 Intramuscular   Manufacturer: Moderna   Lot: [id number]   [place]:[id number]

## 2020-03-18 ENCOUNTER — Other Ambulatory Visit: Payer: Self-pay

## 2020-03-18 ENCOUNTER — Ambulatory Visit: Payer: PRIVATE HEALTH INSURANCE | Attending: Internal Medicine

## 2020-03-18 DIAGNOSIS — Z23 Encounter for immunization: Secondary | ICD-10-CM

## 2020-03-18 NOTE — Progress Notes (Signed)
   Covid-19 Vaccination Clinic  Name:  Cory Perez    MRN: [id number] DOB: 1965/12/20  03/18/2020  Cory Perez was observed post Covid-19 immunization for 15 minutes without incident. He was provided with Vaccine Information Sheet and instruction to access the V-Safe system.   Cory Perez was instructed to call 911 with any severe reactions post vaccine: Marland Kitchen Difficulty breathing  . Swelling of face and throat  . A fast heartbeat  . A bad rash all over body  . Dizziness and weakness   Immunizations Administered    Name Date Dose VIS Date Route   Moderna COVID-19 Vaccine 03/18/2020 12:29 PM 0.5 mL 10/2019 Intramuscular   Manufacturer: Moderna   Lot: [id number]   [id number]:[id number]

## 2020-10-21 DIAGNOSIS — I5189 Other ill-defined heart diseases: Secondary | ICD-10-CM

## 2020-10-21 HISTORY — DX: Other ill-defined heart diseases: I51.89

## 2020-11-03 ENCOUNTER — Other Ambulatory Visit: Payer: Self-pay | Admitting: General Surgery

## 2020-11-03 NOTE — Progress Notes (Signed)
Subjective:     Patient ID: Cory Perez is a 54 y.o. male.  HPI  The following portions of the patient's history were reviewed and updated as appropriate.  This a new patient is here today for: office visit. The patient is here today to discuss a colonoscopy. His last colonoscopy was completed in 2018 by Dr. Rayann Heman. He denies any rectal bleeding or pain. Patient does have a sister that was diagnosed with colon cancer at age 70.        Chief Complaint  Patient presents with  . Colonoscopy    discussion        Vitals:   11/03/20 1635  BP: 142/82  Pulse: 60  Temp: 36.8 C (98.2 F)  SpO2: 97%  Weight: (!) 139.3 kg (307 lb)  Height: 198.1 cm (6\' 6" )        Past Medical History:  Diagnosis Date  . Abnormal results of liver function studies   . Aldosterone excess (Conn syndrome) (CMS-HCC)   . Anxiety   . Arrhythmia   . Depression   . DJD (degenerative joint disease)   . DM2 (diabetes mellitus, type 2) (CMS-HCC)   . History of chicken pox   . History of syncope   . HLD (hyperlipidemia)   . HTN (hypertension)   . Hx of atrioventricular node ablation   . Hypersomnia with sleep apnea, unspecified   . Lumbar spinal stenosis   . Other and unspecified angina pectoris (CMS-HCC)   . Psoriasis   . Seasonal allergies    Chronic.  Marland Kitchen Seborrheic dermatitis   . Sleep apnea           Past Surgical History:  Procedure Laterality Date  . ADRENALECTOMY Right 02/2016  . COLONOSCOPY  12/05/2003,09/05/2002   Dr. Chauncey Cruel. [place] @ [place] - Adenomatous Polyps, [facility] (Sister)  . COLONOSCOPY  04/21/2017   Dr. Dorthy Cooler @ [place] - Adenomatous Polyps, [facility] (Sister): CBF 04/2020  . COLOSTOMY    . JOINT REPLACEMENT Left 02/12/2014   Left knee medial MAKOplasty.  . L3-5 lumbar decompression  03/06/2019   Dr Meade Maw at [facility]  . LAMINECTOMY CERVICAL FOR DREZ  02/2019  . UPP surgery on throat    . Vasectomy          Social History         Socioeconomic History  . Marital status: Married    Spouse name: Not on file  . Number of children: 1  . Years of education: Not on file  . Highest education level: Not on file  Occupational History  . Occupation: [employer]     Comment: Museum/gallery curator  Tobacco Use  . Smoking status: Never Smoker  . Smokeless tobacco: Never Used  . Tobacco comment: only smoked for a week  Vaping Use  . Vaping Use: Never used  Substance and Sexual Activity  . Alcohol use: Yes    Alcohol/week: 0.0 standard drinks    Comment: rarely  . Drug use: No  . Sexual activity: Defer  Other Topics Concern  . Not on file  Social History Narrative   W[employer]ks for [employer] none   etoh occas   married 3 girls    Social Determinants of Health   Financial Resource Strain: Not on file  Food Insecurity: Not on file  Transportation Needs: Not on file         Allergies  Allergen Reactions  . Nizoral [Ketoconazole] Hives  Current Outpatient Medications  Medication Sig Dispense Refill  . acetaminophen (TYLENOL) 500 mg capsule Take 500 mg by mouth every 4 (four) hours as needed for Fever or Pain.    . baclofen (LIORESAL) 10 MG tablet Take 1 tablet (10 mg total) by mouth 3 (three) times daily 90 tablet 1  . BD INSULIN PEN NEEDLE UF MINI 31 gauge x 3/16" needle USE AS DIRECTED 100 each 1  . blood glucose diagnostic test strip Use 1 each (1 strip total) 3 (three) times daily Use as instructed. 100 each 12  . cyanocobalamin (VITAMIN B12) 1000 MCG tablet Take 1,000 mcg by mouth once daily.    . fexofenadine (ALLEGRA) 180 MG tablet Take 180 mg by mouth once daily.    . fluticasone propionate (FLONASE) 50 mcg/actuation nasal spray Place 2 sprays into both nostrils once daily as needed for Rhinitis 48 g 1  . meloxicam (MOBIC) 15 MG tablet TAKE ONE TABLET BY MOUTH EVERY DAY 30 tablet 11  . metoprolol succinate (TOPROL-XL) 50 MG XL tablet  TAKE ONE TABLET EVERY DAY 30 tablet 5  . OZEMPIC 0.25 mg or 0.5 mg(2 mg/1.5 mL) pen injector INJECT 0.5 MG SUBCUTANEOUSLY EVERY 7 DAYS 1.5 mL 12  . pioglitazone (ACTOS) 30 MG tablet TAKE 1 TABLET BY MOUTH ONCE DAILY 30 tablet 11  . rosuvastatin (CRESTOR) 10 MG tablet TAKE ONE TABLET EVERY DAY 90 tablet 1  . SECUKINUMAB SUBQ Inject 300 mg subcutaneously monthly for psoriasis    . sertraline (ZOLOFT) 100 MG tablet Take 2 tablets (200 mg total) by mouth once daily 180 tablet 1  . SYNJARDY XR 12.5-1,000 mg XR 24 hr biphasic tablet TAKE TWO TABLETS BY MOUTH ONCE DAILY 60 tablet 12  . VITAMIN D2 1,250 mcg (50,000 unit) capsule TAKE 1 CAPSULE BY MOUTH ONCE A WEEK 12 capsule 1   No current facility-administered medications for this visit.         Family History  Problem Relation Age of Onset  . Diabetes Mother   . High blood pressure (Hypertension) Mother   . Myocardial Infarction (Heart attack) Mother 54       CABGx2  . Diabetes Father   . High blood pressure (Hypertension) Father   . Psoriasis Father   . Colon cancer Sister   . Diabetes Brother   . Psoriasis Brother   . Anesthesia problems Neg Hx   . Malignant hypertension Neg Hx   . Malignant hyperthermia Neg Hx      Labs and Radiology:   April 21, 2017 colonoscopy completed by Arther Dames, MD at [place]:  DIAGNOSIS  A. 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 No high grade dysplasia or carcinoma is seen.  B. Descending colon polyp, endoscopic biopsy:  Tubular adenoma. No high grade dysplasia or carcinoma is seen    Review of the procedure note describes a 10 mm polyp in the descending colon that was pedunculated.  Removed with hot snare.  7 mm polyp in the ascending colon removed with a hot snare.  3 sessile polyps noted in the transverse colon hepatic flexure and ascending colon that were 3-4 mm in size removed with jumbo cold  forceps.  Diverticuli were noted.  October 07, 2020 hemoglobin A1c: 7.8. CBC and comprehensive metabolic panel October 13, 2020:  Blood sugar 163, otherwise normal. CBC notable for minimal MCH increase..  Platelet count notable at 113,000.  Chronic thrombocytopenia  dating back to 2017.  October 21, 2020 echo: Mitral and aortic regurg, minor.  Ejection fraction 55%.  Review of Systems  Constitutional: Negative for chills and fever.  Respiratory: Negative for cough.        Objective:   Physical Exam Constitutional:      Appearance: Normal appearance.  Cardiovascular:     Rate and Rhythm: Normal rate and regular rhythm.     Pulses: Normal pulses.     Heart sounds: Normal heart sounds.  Pulmonary:     Effort: Pulmonary effort is normal.     Breath sounds: Normal breath sounds.  Neurological:     Mental Status: He is alert and oriented to person, place, and time.  Psychiatric:        Mood and Affect: Mood normal.        Behavior: Behavior normal.        Assessment:     Family history of colon cancer, younger sister at age 51.  Multiple colonic polyps (5) at the time of the 2018 exam).    Non-insulin-dependent diabetes mellitus.  Thrombocytopenia.    Plan:     The patient was encouraged to speak with his sister to find out if genetic testing was done as she was under age 59 at diagnosis.  Colonoscopy to be scheduled.   The patient has been asked to hold Synjardy the day of colonoscopy prep and procedure.   Patient to follow up as scheduled and is aware to call for any new issues or concerns.     Entered by Ledell Noss, CMA, acting as a scribe for Dr. Hervey Ard, MD.   Entered by Karie Fetch, RN, acting as a scribe for Dr. Hervey Ard, MD.    The documentation recorded by the scribe accurately reflects the service I personally performed and the decisions made by me.   Robert Bellow, MD FACS

## 2020-12-02 ENCOUNTER — Other Ambulatory Visit: Payer: PRIVATE HEALTH INSURANCE

## 2020-12-23 ENCOUNTER — Other Ambulatory Visit: Admission: RE | Admit: 2020-12-23 | Payer: PRIVATE HEALTH INSURANCE | Source: Ambulatory Visit

## 2020-12-25 ENCOUNTER — Other Ambulatory Visit: Payer: Self-pay

## 2020-12-25 ENCOUNTER — Ambulatory Visit: Payer: PRIVATE HEALTH INSURANCE | Admitting: Certified Registered Nurse Anesthetist

## 2020-12-25 ENCOUNTER — Encounter: Payer: Self-pay | Admitting: General Surgery

## 2020-12-25 ENCOUNTER — Ambulatory Visit
Admission: RE | Admit: 2020-12-25 | Discharge: 2020-12-25 | Disposition: A | Discharge status: Home / Self Care | Payer: PRIVATE HEALTH INSURANCE | Attending: General Surgery | Admitting: General Surgery

## 2020-12-25 ENCOUNTER — Encounter
Admission: RE | Disposition: A | Discharge status: Home / Self Care | Payer: Self-pay | Source: Home / Self Care | Attending: General Surgery

## 2020-12-25 DIAGNOSIS — K5730 Diverticulosis of large intestine without perforation or abscess without bleeding: Secondary | ICD-10-CM | POA: Diagnosis not present

## 2020-12-25 DIAGNOSIS — Z79899 Other long term (current) drug therapy: Secondary | ICD-10-CM | POA: Insufficient documentation

## 2020-12-25 DIAGNOSIS — Z800 Family history of malignant neoplasm of digestive organs: Secondary | ICD-10-CM | POA: Insufficient documentation

## 2020-12-25 DIAGNOSIS — Z883 Allergy status to other anti-infective agents status: Secondary | ICD-10-CM | POA: Diagnosis not present

## 2020-12-25 DIAGNOSIS — D122 Benign neoplasm of ascending colon: Secondary | ICD-10-CM | POA: Insufficient documentation

## 2020-12-25 DIAGNOSIS — Z1211 Encounter for screening for malignant neoplasm of colon: Secondary | ICD-10-CM | POA: Diagnosis not present

## 2020-12-25 DIAGNOSIS — Z888 Allergy status to other drugs, medicaments and biological substances status: Secondary | ICD-10-CM | POA: Insufficient documentation

## 2020-12-25 DIAGNOSIS — Z7984 Long term (current) use of oral hypoglycemic drugs: Secondary | ICD-10-CM | POA: Diagnosis not present

## 2020-12-25 DIAGNOSIS — Z86010 Personal history of colonic polyps: Secondary | ICD-10-CM | POA: Diagnosis present

## 2020-12-25 HISTORY — PX: COLONOSCOPY WITH PROPOFOL: SHX5780

## 2020-12-25 LAB — GLUCOSE, CAPILLARY: Glucose-Capillary: 157 mg/dL — ABNORMAL HIGH (ref 70–99)

## 2020-12-25 SURGERY — COLONOSCOPY WITH PROPOFOL
Anesthesia: General

## 2020-12-25 MED ORDER — GLYCOPYRROLATE 0.2 MG/ML IJ SOLN
INTRAMUSCULAR | Status: AC
  Filled 2020-12-25: qty 1

## 2020-12-25 MED ORDER — PROPOFOL 500 MG/50ML IV EMUL
INTRAVENOUS | Status: AC
  Filled 2020-12-25: qty 150

## 2020-12-25 MED ORDER — PROPOFOL 10 MG/ML IV BOLUS
INTRAVENOUS | Status: AC
  Filled 2020-12-25: qty 60

## 2020-12-25 MED ORDER — PROPOFOL 500 MG/50ML IV EMUL
INTRAVENOUS | Status: DC | PRN
  Administered 2020-12-25: 140 ug/kg/min via INTRAVENOUS

## 2020-12-25 MED ORDER — GLYCOPYRROLATE 0.2 MG/ML IJ SOLN
INTRAMUSCULAR | Status: DC | PRN
  Administered 2020-12-25: .2 mg via INTRAVENOUS

## 2020-12-25 MED ORDER — PROPOFOL 500 MG/50ML IV EMUL
INTRAVENOUS | Status: AC
  Filled 2020-12-25: qty 50

## 2020-12-25 MED ORDER — LIDOCAINE HCL (CARDIAC) PF 100 MG/5ML IV SOSY
PREFILLED_SYRINGE | INTRAVENOUS | Status: DC | PRN
  Administered 2020-12-25: 100 mg via INTRAVENOUS

## 2020-12-25 MED ORDER — SODIUM CHLORIDE 0.9 % IV SOLN: INTRAVENOUS | Status: DC

## 2020-12-25 MED ORDER — DEXMEDETOMIDINE (PRECEDEX) IN NS 20 MCG/5ML (4 MCG/ML) IV SYRINGE
PREFILLED_SYRINGE | INTRAVENOUS | Status: DC | PRN
  Administered 2020-12-25 (×2): 4 ug via INTRAVENOUS

## 2020-12-25 MED ORDER — EPHEDRINE SULFATE 50 MG/ML IJ SOLN
INTRAMUSCULAR | Status: DC | PRN
  Administered 2020-12-25 (×2): 10 mg via INTRAVENOUS
  Administered 2020-12-25: 5 mg via INTRAVENOUS
  Administered 2020-12-25: 10 mg via INTRAVENOUS

## 2020-12-25 MED ORDER — PROPOFOL 10 MG/ML IV BOLUS
INTRAVENOUS | Status: DC | PRN
  Administered 2020-12-25: 60 mg via INTRAVENOUS

## 2020-12-25 MED ORDER — LIDOCAINE HCL (PF) 2 % IJ SOLN
INTRAMUSCULAR | Status: AC
  Filled 2020-12-25: qty 30

## 2020-12-25 NOTE — Anesthesia Preprocedure Evaluation (Signed)
Anesthesia Evaluation  Patient identified by MRN, date of birth, ID band Patient awake    Reviewed: Allergy & Precautions, NPO status , Patient's Chart, lab work & pertinent test results  History of Anesthesia Complications Negative for: history of anesthetic complications  Airway Mallampati: III       Dental   Pulmonary sleep apnea and Continuous Positive Airway Pressure Ventilation , neg COPD, Not current smoker,           Cardiovascular hypertension, Pt. on medications (-) Past MI and (-) CHF + dysrhythmias (Aflutter, s/p ablation, no problems since) Atrial Fibrillation      Neuro/Psych neg Seizures Anxiety Depression    GI/Hepatic Neg liver ROS, neg GERD  ,  Endo/Other  diabetes, Type 2, Oral Hypoglycemic Agents  Renal/GU negative Renal ROS     Musculoskeletal   Abdominal   Peds  Hematology   Anesthesia Other Findings   Reproductive/Obstetrics                             Anesthesia Physical Anesthesia Plan  ASA: III  Anesthesia Plan: General   Post-op Pain Management:    Induction: Intravenous  PONV Risk Score and Plan: 2 and Propofol infusion and TIVA  Airway Management Planned: Nasal Cannula  Additional Equipment:   Intra-op Plan:   Post-operative Plan:   Informed Consent: I have reviewed the patients History and Physical, chart, labs and discussed the procedure including the risks, benefits and alternatives for the proposed anesthesia with the patient or authorized representative who has indicated his/her understanding and acceptance.       Plan Discussed with:   Anesthesia Plan Comments:         Anesthesia Quick Evaluation

## 2020-12-25 NOTE — Op Note (Signed)
[facility] Gastroenterology Patient Name: Cory Perez Procedure Date: 12/25/2020 7:14 AM MRN: [id number] Account #: [id number] Date of Birth: February 02, 1966 Admit Type: Outpatient Age: 55 Room: [facility] ENDO ROOM 1 Gender: Male Note Status: Finalized Procedure:             Colonoscopy Indications:           Screening in patient at increased risk: Family history                         of 1st-degree relative with colorectal cancer before                         age 61 years, High risk colon cancer surveillance:                         Personal history of colonic polyps Providers:             Robert Bellow, MD Medicines:             Monitored Anesthesia Care Complications:         No immediate complications. Procedure:             Pre-Anesthesia Assessment:                        - Prior to the procedure, a History and Physical was                         performed, and patient medications, allergies and                         sensitivities were reviewed. The patient's tolerance                         of previous anesthesia was reviewed.                        - The risks and benefits of the procedure and the                         sedation options and risks were discussed with the                         patient. All questions were answered and informed                         consent was obtained.                        After obtaining informed consent, the colonoscope was                         passed under direct vision. Throughout the procedure,                         the patient's blood pressure, pulse, and oxygen                         saturations were monitored continuously. The was  introduced through the anus and advanced to the the                         cecum, identified by appendiceal orifice and ileocecal                         valve. The colonoscopy was somewhat difficult due to a                         tortuous colon.  Successful completion of the procedure                         was aided by using manual pressure. The patient                         tolerated the procedure well. The quality of the bowel                         preparation was adequate to identify polyps 6 mm and                         larger in size. Findings:      A 5 mm polyp was found in the ascending colon. The polyp was sessile.       Biopsies were taken with a cold forceps for histology.      A few medium-mouthed diverticula were found in the ascending colon.      The retroflexed view of the distal rectum and anal verge was normal and       showed no anal or rectal abnormalities. Impression:            - One 5 mm polyp in the ascending colon. Biopsied.                        - Diverticulosis in the ascending colon.                        - The distal rectum and anal verge are normal on                         retroflexion view. Recommendation:        - Repeat colonoscopy in 5 years for surveillance.                        - Telephone endoscopist for pathology results in 1                         week. Procedure Code(s):     --- Professional ---                        [phone], Colonoscopy, flexible; with biopsy, single or                         multiple Diagnosis Code(s):     --- Professional ---                        Z80.0, Family history of malignant neoplasm of  digestive organs                        Z86.010, Personal history of colonic polyps                        K63.5, Polyp of colon                        K57.30, Diverticulosis of large intestine without                         perforation or abscess without bleeding CPT copyright 2019 American Medical Association. All rights reserved. The codes documented in this report are preliminary and upon coder review may  be revised to meet current compliance requirements. Robert Bellow, MD 12/25/2020 8:25:53 AM This report has been signed  electronically. Number of Addenda: 0 Note Initiated On: 12/25/2020 7:14 AM Scope Withdrawal Time: 0 hours 18 minutes 45 seconds  Total Procedure Duration: 0 hours 32 minutes 36 seconds  Estimated Blood Loss:  Estimated blood loss: none.      [facility]

## 2020-12-25 NOTE — Transfer of Care (Signed)
Immediate Anesthesia Transfer of Care Note  Patient: Cory Perez  Procedure(s) Performed: COLONOSCOPY WITH PROPOFOL (N/A )  Patient Location: PACU and Endoscopy Unit  Anesthesia Type:General  Level of Consciousness: drowsy  Airway & Oxygen Therapy: Patient Spontanous Breathing  Post-op Assessment: Report given to RN and Post -op Vital signs reviewed and stable  Post vital signs: Reviewed and stable  Last Vitals:  Vitals Value Taken Time  BP 104/61 12/25/20 0825  Temp 36.6 C 12/25/20 0825  Pulse 68 12/25/20 0826  Resp 12 12/25/20 0825  SpO2 95 % 12/25/20 0826  Vitals shown include unvalidated device data.  Last Pain:  Vitals:   12/25/20 0825  TempSrc: Temporal  PainSc: Asleep         Complications: No complications documented.

## 2020-12-25 NOTE — Anesthesia Postprocedure Evaluation (Signed)
Anesthesia Post Note  Patient: Cory Perez  Procedure(s) Performed: COLONOSCOPY WITH PROPOFOL (N/A )  Patient location during evaluation: Endoscopy Anesthesia Type: General Level of consciousness: awake and alert Pain management: pain level controlled Vital Signs Assessment: post-procedure vital signs reviewed and stable Respiratory status: spontaneous breathing and respiratory function stable Cardiovascular status: stable Anesthetic complications: no   No complications documented.   Last Vitals:  Vitals:   12/25/20 0845 12/25/20 0855  BP: 128/75 126/68  Pulse: 65 62  Resp: 16 10  Temp:    SpO2: 98% 96%    Last Pain:  Vitals:   12/25/20 0855  TempSrc:   PainSc: 0-No pain                 KEPHART,WILLIAM K

## 2020-12-25 NOTE — H&P (Signed)
Cory Perez [id number] [birth year]     HPI:  55 y/o male with 5 polyps at the time of his 2018 exam.  Sister w/ colon cancer at age 35. By report, genetic testing was negative. Tolerated prep well.  For colonoscopy.   Medications Prior to Admission  Medication Sig Dispense Refill Last Dose  . metoprolol succinate (TOPROL-XL) 50 MG 24 hr tablet Take 50 mg by mouth daily. Take with or immediately following a meal.   12/25/2020 at Unknown time  . acetaminophen (TYLENOL) 500 MG tablet Take 1,000 mg by mouth every 6 (six) hours as needed (headache/pain.).     . Canagliflozin-metFORMIN HCl ER (INVOKAMET XR) [phone] MG TB24 Take 2 tablets by mouth daily.      . cholecalciferol (VITAMIN D) 25 MCG (1000 UT) tablet Take 1,000 Units by mouth daily.     . fexofenadine (ALLEGRA) 180 MG tablet Take 180 mg by mouth daily.     . fluticasone (FLONASE) 50 MCG/ACT nasal spray Place 2 sprays into both nostrils at bedtime.      . Insulin Pen Needle 31G X 5 MM MISC by Does not apply route. Could not find exact match. See [facility] chart for where it is listed.     . methocarbamol (ROBAXIN) 500 MG tablet Take 1 tablet (500 mg total) by mouth 3 (three) times daily. 90 tablet 0   . oxyCODONE (OXY IR/ROXICODONE) 5 MG immediate release tablet Take 1 tablet (5 mg total) by mouth every 3 (three) hours as needed for moderate pain ((score 4 to 6)). 30 tablet 0   . pioglitazone (ACTOS) 15 MG tablet Take 15 mg by mouth daily.     . Secukinumab (COSENTYX, 300 MG DOSE, [place]) Inject 300 mg into the skin every 30 (thirty) days.      . Semaglutide,0.25 or 0.5MG /DOS, (OZEMPIC, 0.25 OR 0.5 MG/DOSE,) 2 MG/1.5ML SOPN Inject 0.5 mg into the skin every Wednesday.      . sertraline (ZOLOFT) 100 MG tablet Take 200 mg by mouth at bedtime.      . SOOLANTRA 1 % CREA Apply 1 application topically daily as needed (rosacea).      . vitamin B-12 (CYANOCOBALAMIN) 1000 MCG tablet Take 1,000 mcg by mouth daily.      Allergies  Allergen Reactions  .  Ketoconazole Hives   Past Medical History:  Diagnosis Date  . Abnormal results of liver function studies   . Aldosterone excess (Conn syndrome) ([place])   . Anxiety   . Arrhythmia   . Atrial flutter, paroxysmal ([place]) 12/24/2018   Sp ablation 2017  . Depression   . DJD (degenerative joint disease)   . DM2 (diabetes mellitus, type 2) ([place])   . History of chicken pox   . History of syncope   . HLD (hyperlipidemia)   . HTN (hypertension)   . Hx of atrioventricular node ablation   . Hypersomnia with sleep apnea   . Lumbar spinal stenosis   . Other forms of angina pectoris ([place])    Other and unspecified angina pectoris  . Psoriasis   . Seasonal allergies   . Seborrheic dermatitis   . Sleep apnea    Past Surgical History:  Procedure Laterality Date  . ADRENALECTOMY Right 02/20/2016 - 03/20/2016  . CARDIAC ELECTROPHYSIOLOGY MAPPING AND ABLATION    . COLONOSCOPY  12/05/2003, 09/05/2002   Dr. Chauncey Cruel. [place] @ [place] - Adenomatous Polyps, [facility] (sister)  . JOINT REPLACEMENT Left 02/12/2014   Left knee medial MAKOplasty  .  LUMBAR LAMINECTOMY/DECOMPRESSION MICRODISCECTOMY N/A 03/06/2019   Procedure: LUMBAR LAMINECTOMY/DECOMPRESSION MICRODISCECTOMY 2 LEVELS L3-4; L4-5;  Surgeon: Meade Maw, MD;  Location: [facility] ORS;  Service: Neurosurgery;  Laterality: N/A;  . TONSILLECTOMY    . UPP surgery on throat    . VASECTOMY     Social History   Socioeconomic History  . Marital status: Married    Spouse name: Not on file  . Number of children: Not on file  . Years of education: Not on file  . Highest education level: Not on file  Occupational History  . Not on file  Tobacco Use  . Smoking status: Never Smoker  . Smokeless tobacco: Never Used  . Tobacco comment: only smoked for a week  Vaping Use  . Vaping Use: Never used  Substance and Sexual Activity  . Alcohol use: Not on file    Comment: Rarely  . Drug use: Never  . Sexual activity: Yes  Other Topics Concern  . Not on file  Social  History Narrative  . Not on file   Social Determinants of Health   Financial Resource Strain: Not on file  Food Insecurity: Not on file  Transportation Needs: Not on file  Physical Activity: Not on file  Stress: Not on file  Social Connections: Not on file  Intimate Partner Violence: Not on file   Social History   Social History Narrative  . Not on file     ROS: Negative.     PE: HEENT: Negative. Lungs: Clear. Cardio: RR.   Assessment/Plan:  Proceed with planned endoscopy.   Forest Gleason [facility] 12/25/2020

## 2020-12-25 NOTE — Anesthesia Procedure Notes (Signed)
Procedure Name: MAC Date/Time: 12/25/2020 7:40 AM Performed by: Lily Peer, Elizabth Palka, CRNA Pre-anesthesia Checklist: Patient identified, Emergency Drugs available, Suction available, Patient being monitored and Timeout performed Patient Re-evaluated:Patient Re-evaluated prior to induction Oxygen Delivery Method: Nasal cannula Induction Type: IV induction

## 2020-12-28 LAB — SURGICAL PATHOLOGY

## 2021-07-31 ENCOUNTER — Other Ambulatory Visit: Payer: Self-pay

## 2021-07-31 ENCOUNTER — Emergency Department
Admission: EM | Admit: 2021-07-31 | Discharge: 2021-07-31 | Disposition: A | Discharge status: Home / Self Care | Payer: PRIVATE HEALTH INSURANCE | Attending: Emergency Medicine | Admitting: Emergency Medicine

## 2021-07-31 ENCOUNTER — Emergency Department: Payer: PRIVATE HEALTH INSURANCE

## 2021-07-31 DIAGNOSIS — M25511 Pain in right shoulder: Secondary | ICD-10-CM | POA: Diagnosis not present

## 2021-07-31 DIAGNOSIS — E1142 Type 2 diabetes mellitus with diabetic polyneuropathy: Secondary | ICD-10-CM | POA: Diagnosis not present

## 2021-07-31 DIAGNOSIS — R079 Chest pain, unspecified: Secondary | ICD-10-CM | POA: Insufficient documentation

## 2021-07-31 DIAGNOSIS — Z96652 Presence of left artificial knee joint: Secondary | ICD-10-CM | POA: Diagnosis not present

## 2021-07-31 DIAGNOSIS — Z79899 Other long term (current) drug therapy: Secondary | ICD-10-CM | POA: Insufficient documentation

## 2021-07-31 DIAGNOSIS — I10 Essential (primary) hypertension: Secondary | ICD-10-CM | POA: Diagnosis not present

## 2021-07-31 DIAGNOSIS — Z794 Long term (current) use of insulin: Secondary | ICD-10-CM | POA: Insufficient documentation

## 2021-07-31 LAB — BASIC METABOLIC PANEL
Anion gap: 6 (ref 5–15)
BUN: 20 mg/dL (ref 6–20)
CO2: 26 mmol/L (ref 22–32)
Calcium: 9 mg/dL (ref 8.9–10.3)
Chloride: 104 mmol/L (ref 98–111)
Creatinine, Ser: 1.05 mg/dL (ref 0.61–1.24)
GFR, Estimated: >60 mL/min (ref >60)
Glucose, Bld: 131 mg/dL — ABNORMAL HIGH (ref 70–99)
Potassium: 4.2 mmol/L (ref 3.5–5.1)
Sodium: 136 mmol/L (ref 135–145)

## 2021-07-31 LAB — TROPONIN I (HIGH SENSITIVITY)
Troponin I (High Sensitivity): 8 ng/L (ref <18)
Troponin I (High Sensitivity): 9 ng/L (ref <18)

## 2021-07-31 LAB — CBC
HCT: 47.5 % (ref 39.0–52.0)
Hemoglobin: 16.7 g/dL (ref 13.0–17.0)
MCH: 33.1 pg (ref 26.0–34.0)
MCHC: 35.2 g/dL (ref 30.0–36.0)
MCV: 94.2 fL (ref 80.0–100.0)
Platelets: 146 10*3/uL — ABNORMAL LOW (ref 150–400)
RBC: 5.04 MIL/uL (ref 4.22–5.81)
RDW: 13.1 % (ref 11.5–15.5)
WBC: 7.9 10*3/uL (ref 4.0–10.5)
nRBC: 0 % (ref 0.0–0.2)

## 2021-07-31 NOTE — ED Triage Notes (Signed)
Pt to ED to ED for abnormal feeling in chest, almost feeling like heartburn. States feels "rush of anxiety and heat in neck" States he has been stressed at work and not sure if anxiety Hx a flutter Sx started 30 mins ago

## 2021-07-31 NOTE — ED Provider Notes (Signed)
[facility] Emergency Department Provider Note   ____________________________________________   Event Date/Time   First MD Initiated Contact with Patient 07/31/21 1647     (approximate)  I have reviewed the triage vital signs and the nursing notes.   HISTORY  Chief Complaint chest discomfort    HPI Cory Perez is a 55 y.o. male who reports he has had several episodes in the last many months of a funny feeling in his chest and flushing in his neck and the back of his neck.  Does not really go along with shortness of breath for nausea.  He does get some wet sweaty with it.  He had an episode like that today that lasted longer than usual.  He thought possibly it was a panic attack like he has had in the past but this lasted longer.  He was worried because his mother had died of a heart attack.  Did not really have chest tightness or heaviness although he does have pain in the right shoulder because of a pinched nerve it gets worse when it rains.  This was radiating through to the front today.  Discount of an achy pain.        Past Medical History:  Diagnosis Date   Abnormal results of liver function studies    Aldosterone excess (Conn syndrome) (HCC)    Anxiety    Arrhythmia    Atrial flutter, paroxysmal ([place]) 12/24/2018   Sp ablation 2017   Depression    DJD (degenerative joint disease)    DM2 (diabetes mellitus, type 2) ([place])    History of chicken pox    History of syncope    HLD (hyperlipidemia)    HTN (hypertension)    Hx of atrioventricular node ablation    Hypersomnia with sleep apnea    Lumbar spinal stenosis    Other forms of angina pectoris ([place])    Other and unspecified angina pectoris   Psoriasis    Seasonal allergies    Seborrheic dermatitis    Sleep apnea     Patient Active Problem List   Diagnosis Date Noted   S/P lumbar laminectomy 03/06/2019   Atrial flutter, paroxysmal ([place]) 12/24/2018   OSA (obstructive sleep apnea)  12/24/2018   Psoriasis 12/24/2018   Primary hyperaldosteronism ([place]) 12/24/2018   Type 2 diabetes mellitus with diabetic polyneuropathy, without long-term current use of insulin ([place]) 12/24/2018   Hypertension associated with diabetes ([place]) 12/24/2018   Hyperlipidemia associated with type 2 diabetes mellitus ([place]) 12/24/2018   Hypersomnia with sleep apnea 12/24/2018   Lumbar stenosis with neurogenic claudication 08/22/2014   Lumbar radiculitis 08/22/2014   Status post left partial knee replacement 07/31/2014   Low back pain 07/31/2014   Aftercare following joint replacement 04/17/2014    Past Surgical History:  Procedure Laterality Date   ADRENALECTOMY Right 02/20/2016 - 03/20/2016   CARDIAC ELECTROPHYSIOLOGY MAPPING AND ABLATION     COLONOSCOPY  12/05/2003, 09/05/2002   Dr. Chauncey Cruel. [place] @ [place] - Adenomatous Polyps, [facility] (sister)   COLONOSCOPY WITH PROPOFOL N/A 12/25/2020   Procedure: COLONOSCOPY WITH PROPOFOL;  Surgeon: Robert Bellow, MD;  Location: [facility] ENDOSCOPY;  Service: Endoscopy;  Laterality: N/A;  COVID POSITIVE ON 11/24/2020   JOINT REPLACEMENT Left 02/12/2014   Left knee medial MAKOplasty   LUMBAR LAMINECTOMY/DECOMPRESSION MICRODISCECTOMY N/A 03/06/2019   Procedure: LUMBAR LAMINECTOMY/DECOMPRESSION MICRODISCECTOMY 2 LEVELS L3-4; L4-5;  Surgeon: Meade Maw, MD;  Location: [facility] ORS;  Service: Neurosurgery;  Laterality: N/A;   TONSILLECTOMY  UPP surgery on throat     VASECTOMY      Prior to Admission medications   Medication Sig Start Date End Date Taking? Authorizing Provider  acetaminophen (TYLENOL) 500 MG tablet Take 1,000 mg by mouth every 6 (six) hours as needed (headache/pain.).    [provider]  Canagliflozin-metFORMIN HCl ER (INVOKAMET XR) [phone] MG TB24 Take 2 tablets by mouth daily.     [provider]  cholecalciferol (VITAMIN D) 25 MCG (1000 UT) tablet Take 1,000 Units by mouth daily.    [provider]  fexofenadine  (ALLEGRA) 180 MG tablet Take 180 mg by mouth daily.    [provider]  fluticasone (FLONASE) 50 MCG/ACT nasal spray Place 2 sprays into both nostrils at bedtime.     [provider]  Insulin Pen Needle 31G X 5 MM MISC by Does not apply route. Could not find exact match. See [facility] chart for where it is listed.    [provider]  methocarbamol (ROBAXIN) 500 MG tablet Take 1 tablet (500 mg total) by mouth 3 (three) times daily. 03/07/19   Marin Olp, PA-C  metoprolol succinate (TOPROL-XL) 50 MG 24 hr tablet Take 50 mg by mouth daily. Take with or immediately following a meal.    [provider]  oxyCODONE (OXY IR/ROXICODONE) 5 MG immediate release tablet Take 1 tablet (5 mg total) by mouth every 3 (three) hours as needed for moderate pain ((score 4 to 6)). 03/07/19   Marin Olp, PA-C  pioglitazone (ACTOS) 15 MG tablet Take 15 mg by mouth daily.    [provider]  Secukinumab (COSENTYX, 300 MG DOSE, [place]) Inject 300 mg into the skin every 30 (thirty) days.     [provider]  Semaglutide,0.25 or 0.'5MG'$ /DOS, (OZEMPIC, 0.25 OR 0.5 MG/DOSE,) 2 MG/1.5ML SOPN Inject 0.5 mg into the skin every Wednesday.     [provider]  sertraline (ZOLOFT) 100 MG tablet Take 200 mg by mouth at bedtime.     [provider]  SOOLANTRA 1 % CREA Apply 1 application topically daily as needed (rosacea).  09/03/18   [provider]  vitamin B-12 (CYANOCOBALAMIN) 1000 MCG tablet Take 1,000 mcg by mouth daily.    [provider]    Allergies Ketoconazole  No family history on file.  Social History Social History   Tobacco Use   Smoking status: Never   Smokeless tobacco: Never   Tobacco comments:    only smoked for a week  Vaping Use   Vaping Use: Never used  Substance Use Topics   Drug use: Never    Review of Systems  Constitutional: No fever/chills Eyes: No visual changes. ENT: No sore throat. Cardiovascular: Denies  chest pain. Respiratory: Denies shortness of breath. Gastrointestinal: No abdominal pain.  No nausea, no vomiting.  No diarrhea.  No constipation. Genitourinary: Negative for dysuria. Musculoskeletal: Negative for back pain. Skin: Negative for rash. Neurological: No acute findings no focal findings  ____________________________________________   PHYSICAL EXAM:  VITAL SIGNS: ED Triage Vitals [07/31/21 1630]  Enc Vitals Group     BP (!) 151/102     Pulse Rate 71     Resp 20     Temp 99.2 F (37.3 C)     Temp Source Oral     SpO2 97 %     Weight 295 lb (133.8 kg)     Height 6' (1.829 m)     Head Circumference      Peak Flow  Pain Score 0     Pain Loc      Pain Edu?      Excl. in [place]?     Constitutional: Alert and oriented. Well appearing and in no acute distress. Eyes: Conjunctivae are normal. PER Head: Atraumatic. Nose: No congestion/rhinnorhea. Mouth/Throat: Mucous membranes are moist.  Oropharynx non-erythematous. Neck: No stridor.   Cardiovascular: Normal rate, regular rhythm. Grossly normal heart sounds.  Good peripheral circulation. Respiratory: Normal respiratory effort.  No retractions. Lungs CTAB. Gastrointestinal: Soft and nontender. No distention. No abdominal bruits. Musculoskeletal: No lower extremity tenderness nor edema.   Neurologic:  Normal speech and language. No gross focal neurologic deficits are appreciated.  Skin:  Skin is warm, dry and intact. No rash noted.   ____________________________________________   LABS (all labs ordered are listed, but only abnormal results are displayed)  Labs Reviewed  BASIC METABOLIC PANEL - Abnormal; Notable for the following components:      Result Value   Glucose, Bld 131 (*)    All other components within normal limits  CBC - Abnormal; Notable for the following components:   Platelets 146 (*)    All other components within normal limits  TROPONIN I (HIGH SENSITIVITY)  TROPONIN I (HIGH SENSITIVITY)    ____________________________________________  EKG  EKG read interpreted by me shows a flutter rate of 70 normal axis no acute changes ____________________________________________  RADIOLOGY Gertha Calkin, personally viewed and evaluated these images (plain radiographs) as part of my medical decision making, as well as reviewing the written report by the radiologist.  ED MD interpretation: X-ray read by radiology reviewed by me shows no acute changes  Official radiology report(s): DG Chest 2 View  Result Date: 07/31/2021 CLINICAL DATA:  Anxiety, chest pain, heartburn EXAM: CHEST - 2 VIEW COMPARISON:  02/05/2006 FINDINGS: The heart size and mediastinal contours are within normal limits. Both lungs are clear. The visualized skeletal structures are unremarkable. IMPRESSION: No active cardiopulmonary disease. Electronically Signed   By: Randa Ngo M.D.   On: 07/31/2021 16:56    ____________________________________________   PROCEDURES  Procedure(s) performed (including Critical Care):  Procedures   ____________________________________________   INITIAL IMPRESSION / ASSESSMENT AND PLAN / ED COURSE  Patient's troponins are negative.  EKG shows a flutter.  Patient's symptoms were not typical for angina.  I will have him follow-up with cardiology.  He will return for any worsening.  He may benefit from a Holter monitor or other long-term monitoring to make sure that he is not having episodes of rapid rate with the A-flutter.             ____________________________________________   FINAL CLINICAL IMPRESSION(S) / ED DIAGNOSES  Final diagnoses:  Chest pain, unspecified type     ED Discharge Orders     None        Note:  This document was prepared using Dragon voice recognition software and may include unintentional dictation errors.    Nena Polio, MD 07/31/21 [phone]

## 2021-07-31 NOTE — Discharge Instructions (Addendum)
The EKG and heart blood test that we did today do not show a reason for your symptoms.  Your A-flutter has come back however.  I would like you to follow-up with cardiology.  You can either see Dr. Harrell Gave at [facility] or Dr. Clayborn Bigness with [facility].  I have included both of their offices information.  Please call them on Monday let them know you were having an unusual sensation in your chest and neck.  They should be able to see you fairly quickly.  Please return here if you have any further problems.

## 2022-03-28 ENCOUNTER — Other Ambulatory Visit: Payer: Self-pay | Admitting: Family Medicine

## 2022-03-28 DIAGNOSIS — M5416 Radiculopathy, lumbar region: Secondary | ICD-10-CM

## 2022-04-06 ENCOUNTER — Ambulatory Visit
Admission: RE | Admit: 2022-04-06 | Discharge: 2022-04-06 | Disposition: A | Discharge status: Home / Self Care | Payer: PRIVATE HEALTH INSURANCE | Source: Ambulatory Visit | Attending: Family Medicine | Admitting: Family Medicine

## 2022-04-06 DIAGNOSIS — M5416 Radiculopathy, lumbar region: Secondary | ICD-10-CM

## 2022-07-06 NOTE — H&P (View-Only) (Signed)
Referring Physician:  Harvest Dark, FNP [address] [place],  [place] [address]  Primary Physician:  Baxter Hire, MD  DOS: 02/24/19 (L3-5 PSD)  History of Present Illness: 07/06/2022 [name] is here today with a chief complaint of  bilateral low back pain with radiation to the bilateral buttocks and into the legs, the left side is worse. Numbness and tingling in the feet and legs.    He has had symptoms for approximately 6 months since he had a fall in February 2023.  He reports severe pain in his left greater than right leg prickly when he stands or walks.  He can only stand for a few minutes before he has substantial symptoms into his left buttock and down his leg.  He gets numbness and tingling on the top of his foot.  Prolonged sitting, standing, walking make it worse.  Nothing other than laying down makes it better.  His pain is now approximately 9 out of 10 and is impacting his daily life.   Bowel/Bladder Dysfunction: none  Conservative measures:  Physical therapy: has participated in at [facility] for 3 weeks.  Multimodal medical therapy including regular antiinflammatories:  baclofen, meloxicam, tylenol, robaxin, oxycodone  Injections:  has had epidural steroid injections 04/27/2022: Left L5-S1 and left S1 transforaminal ESI (50% relief)  03/11/2022: Bilateral L4-5 and L5-S1 facet joints injections (2 days of relief then return of pain) 01/20/2021: Bilateral L4-5 and L5-S1 facet joint injections (85% relief for approximately 11 months) 12/17/2019: Bilateral L4-5 and L5-S1 facet joint injections (good relief)  11/26/2018: Bilateral S1 transforaminal ESI (mild relief) 10/10/2014: Bilateral L5-S1 transforaminal ESI (mild relief) 08/29/2014: Bilateral L5-S1 transforaminal ESI (mild relief)   Past Surgery:  L3-L5 decompression in April 2020  Cory Perez has no symptoms of cervical myelopathy.  The symptoms are causing a significant impact on the  patient's life.   Review of Systems:  A 10 point review of systems is negative, except for the pertinent positives and negatives detailed in the HPI.  Past Medical History: Past Medical History:  Diagnosis Date   Abnormal results of liver function studies    Aldosterone excess (Conn syndrome) (HCC)    Anxiety    Arrhythmia    Atrial flutter, paroxysmal ([place]) 12/24/2018   Sp ablation 2017   Depression    DJD (degenerative joint disease)    DM2 (diabetes mellitus, type 2) (HCC)    History of chicken pox    History of syncope    HLD (hyperlipidemia)    HTN (hypertension)    Hx of atrioventricular node ablation    Hypersomnia with sleep apnea    Lumbar spinal stenosis    Other forms of angina pectoris ([place])    Other and unspecified angina pectoris   Psoriasis    Seasonal allergies    Seborrheic dermatitis    Sleep apnea     Past Surgical History: Past Surgical History:  Procedure Laterality Date   ADRENALECTOMY Right 02/20/2016 - 03/20/2016   CARDIAC ELECTROPHYSIOLOGY MAPPING AND ABLATION     COLONOSCOPY  12/05/2003, 09/05/2002   Dr. Chauncey Cruel. B[place]rim @ [place] - Adenomatous Polyps, [facility] (sister)   COLONOSCOPY WITH PROPOFOL N/A 12/25/2020   Procedure: COLONOSCOPY WITH PROPOFOL;  Surgeon: Robert Bellow, MD;  Location: [facility] ENDOSCOPY;  Service: Endoscopy;  Laterality: N/A;  COVID POSITIVE ON 11/24/2020   JOINT REPLACEMENT Left 02/12/2014   Left knee medial MAKOplasty   LUMBAR LAMINECTOMY/DECOMPRESSION MICRODISCECTOMY N/A 03/06/2019   Procedure: LUMBAR  LAMINECTOMY/DECOMPRESSION MICRODISCECTOMY 2 LEVELS L3-4; L4-5;  Surgeon: Meade Maw, MD;  Location: [facility] ORS;  Service: Neurosurgery;  Laterality: N/A;   TONSILLECTOMY     UPP surgery on throat     VASECTOMY      Allergies: Allergies as of 07/07/2022 - Review Complete 07/31/2021  Allergen Reaction Noted   Ketoconazole Hives 12/24/2018    Medications: No outpatient medications have been marked as taking for the 07/07/22  encounter (Appointment) with Meade Maw, MD.    Social History: Social History   Tobacco Use   Smoking status: Never   Smokeless tobacco: Never   Tobacco comments:    only smoked for a week  Vaping Use   Vaping Use: Never used  Substance Use Topics   Drug use: Never    Family Medical History: No family history on file.  Physical Examination: There were no vitals filed for this visit.  General: Patient is well developed, well nourished, calm, collected, and in no apparent distress. Attention to examination is appropriate.  Neck:   Supple.  Full range of motion.  Respiratory: Patient is breathing without any difficulty.   NEUROLOGICAL:     Awake, alert, oriented to person, place, and time.  Speech is clear and fluent. Fund of knowledge is appropriate.   Cranial Nerves: Pupils equal round and reactive to light.  Facial tone is symmetric.  Facial sensation is symmetric. Shoulder shrug is symmetric. Tongue protrusion is midline.  There is no pronator drift.  ROM of spine: full.    Strength: Side Biceps Triceps Deltoid Interossei Grip Wrist Ext. Wrist Flex.  R '5 5 5 5 5 5 5  '$ L '5 5 5 5 5 5 5   '$ Side Iliopsoas Quads Hamstring PF DF EHL  R '5 5 5 5 5 5  '$ L '5 5 5 5 5 5   '$ Reflexes are 1+ and symmetric at the biceps, triceps, brachioradialis, patella and achilles.   Hoffman's is absent.   Bilateral upper and lower extremity sensation is intact to light touch.    No evidence of dysmetria noted.    Medical Decision Making  Imaging: MRI L spine 04/06/22 IMPRESSION: Interval laminectomy on the right at L3-4 with improvement in spinal stenosis which is now moderately severe. Moderate to severe subarticular stenosis bilaterally similar   Interval right laminectomy L4-5 with improvement in spinal stenosis which is now moderately severe. Moderate subarticular and foraminal stenosis left greater than right similar to the prior study   Left S1 nerve root impingement  in the subarticular zone at L5-S1. This appears to be due to a posterior epidural process such as synovial cyst measuring approximately 5 mm.     Electronically Signed   By: Franchot Gallo M.D.   On: 04/07/2022 10:17  I have personally reviewed the images and agree with the above interpretation.  Assessment and Plan: Cory Perez is a pleasant 56 y.o. male with recurrent symptoms of lumbar radiculopathy and neurogenic claudication.  He reports symptoms for at least 6 months.  He is started physical therapy but not been formally discharged.  I think further conservative management is very unlikely to improve his condition.  Thus, I did recommend surgical intervention.  To fulfill conservative management requirements, he may need to return to physical therapy to see whether continuation of conservative management is appropriate from their standpoint.  We will be happy to move forward with surgical intervention once physical therapy has shown 6 weeks of lack of improvement or he has been  formally discharged.  I discussed the planned procedure at length with the patient, including the risks, benefits, alternatives, and indications. The risks discussed include but are not limited to bleeding, infection, need for reoperation, spinal fluid leak, stroke, vision loss, anesthetic complication, coma, paralysis, and even death. I also described in detail that improvement was not guaranteed.  The patient expressed understanding of these risks, and asked that we proceed with surgery. I described the surgery in layman's terms, and gave ample opportunity for questions, which were answered to the best of my ability.    I spent a total of 30 minutes in face-to-face and non-face-to-face activities related to this patient's care today.  Thank you for involving me in the care of this patient.      Kayden Hutmacher K. Izora Ribas MD, [facility] Neurosurgery

## 2022-07-06 NOTE — Progress Notes (Unsigned)
Referring Physician:  Harvest Dark, [place] [address] [place],  [place] [address]  Primary Physician:  Baxter Hire, MD  History of Present Illness: 07/06/2022 [name] is here today with a chief complaint of *** bilateral low back pain with radiation to the bilateral buttock   Duration: *** April 2020 Location: *** Quality: *** Severity: *** 10/10 Precipitating: aggravated by ***physical activity, prolonged sitting Modifying factors: made better by ***nothing? Weakness: none Timing: *** constant  Bowel/Bladder Dysfunction: none  Conservative measures:  Physical therapy: *** has participated in at [facility] Multimodal medical therapy including regular antiinflammatories: *** baclofen, meloxicam, tylenol, robaxin, oxycodone  Injections: *** has had epidural steroid injections 04/27/2022: Left L5-S1 and left S1 transforaminal ESI (50% relief)  03/11/2022: Bilateral L4-5 and L5-S1 facet joints injections (2 days of relief then return of pain) 01/20/2021: Bilateral L4-5 and L5-S1 facet joint injections (85% relief for approximately 11 months) 12/17/2019: Bilateral L4-5 and L5-S1 facet joint injections (good relief)  11/26/2018: Bilateral S1 transforaminal ESI (mild relief) 10/10/2014: Bilateral L5-S1 transforaminal ESI (mild relief) 08/29/2014: Bilateral L5-S1 transforaminal ESI (mild relief)   Past Surgery: *** L3-L5 decompression in April 2020  Cory Perez has ***no symptoms of cervical myelopathy.  The symptoms are causing a significant impact on the patient's life.   Review of Systems:  A 10 point review of systems is negative, except for the pertinent positives and negatives detailed in the HPI.  Past Medical History: Past Medical History:  Diagnosis Date   Abnormal results of liver function studies    Aldosterone excess (Conn syndrome) (HCC)    Anxiety    Arrhythmia    Atrial flutter, paroxysmal ([place]) 12/24/2018   Sp ablation 2017    Depression    DJD (degenerative joint disease)    DM2 (diabetes mellitus, type 2) (HCC)    History of chicken pox    History of syncope    HLD (hyperlipidemia)    HTN (hypertension)    Hx of atrioventricular node ablation    Hypersomnia with sleep apnea    Lumbar spinal stenosis    Other forms of angina pectoris ([place])    Other and unspecified angina pectoris   Psoriasis    Seasonal allergies    Seborrheic dermatitis    Sleep apnea     Past Surgical History: Past Surgical History:  Procedure Laterality Date   ADRENALECTOMY Right 02/20/2016 - 03/20/2016   CARDIAC ELECTROPHYSIOLOGY MAPPING AND ABLATION     COLONOSCOPY  12/05/2003, 09/05/2002   Dr. Chauncey Cruel. [place] @ [facility] - Adenomatous Polyps, [facility] (sister)   COLONOSCOPY WITH PROPOFOL N/A 12/25/2020   Procedure: COLONOSCOPY WITH PROPOFOL;  Surgeon: Robert Bellow, MD;  Location: [facility] ENDOSCOPY;  Service: Endoscopy;  Laterality: N/A;  COVID POSITIVE ON 11/24/2020   JOINT REPLACEMENT Left 02/12/2014   Left knee medial MAKOplasty   LUMBAR LAMINECTOMY/DECOMPRESSION MICRODISCECTOMY N/A 03/06/2019   Procedure: LUMBAR LAMINECTOMY/DECOMPRESSION MICRODISCECTOMY 2 LEVELS L3-4; L4-5;  Surgeon: Meade Maw, MD;  Location: [facility] ORS;  Service: Neurosurgery;  Laterality: N/A;   TONSILLECTOMY     UPP surgery on throat     VASECTOMY      Allergies: Allergies as of 07/07/2022 - Review Complete 07/31/2021  Allergen Reaction Noted   Ketoconazole Hives 12/24/2018    Medications: No outpatient medications have been marked as taking for the 07/07/22 encounter (Appointment) with Meade Maw, MD.    Social History: Social History   Tobacco Use   Smoking status: Never  Smokeless tobacco: Never   Tobacco comments:    only smoked for a week  Vaping Use   Vaping Use: Never used  Substance Use Topics   Drug use: Never    Family Medical History: No family history on file.  Physical Examination: There were no vitals filed for this  visit.  General: Patient is well developed, well nourished, calm, collected, and in no apparent distress. Attention to examination is appropriate.  Neck:   Supple.  ***Full range of motion.  Respiratory: Patient is breathing without any difficulty.   NEUROLOGICAL:     Awake, alert, oriented to person, place, and time.  Speech is clear and fluent. Fund of knowledge is appropriate.   Cranial Nerves: Pupils equal round and reactive to light.  Facial tone is symmetric.  Facial sensation is symmetric. Shoulder shrug is symmetric. Tongue protrusion is midline.  There is no pronator drift.  ROM of spine: full.    Strength: Side Biceps Triceps Deltoid Interossei Grip Wrist Ext. Wrist Flex.  R '5 5 5 5 5 5 5  '$ L '5 5 5 5 5 5 5   '$ Side Iliopsoas Quads Hamstring PF DF EHL  R '5 5 5 5 5 5  '$ L '5 5 5 5 5 5   '$ Reflexes are ***2+ and symmetric at the biceps, triceps, brachioradialis, patella and achilles.   Hoffman's is absent.  Clonus is not present.  Toes are down-going.  Bilateral upper and lower extremity sensation is intact to light touch.    No evidence of dysmetria noted.  Gait is normal.   ***No difficulty with tandem gait.    Medical Decision Making  Imaging: ***  I have personally reviewed the images and agree with the above interpretation.  Assessment and Plan: [name] is a pleasant 56 y.o. male with ***   I spent a total of *** minutes in face-to-face and non-face-to-face activities related to this patient's care today.  Thank you for involving me in the care of this patient.      Caroleena Paolini K. Izora Ribas MD, [facility] Neurosurgery

## 2022-07-07 ENCOUNTER — Encounter: Payer: Self-pay | Admitting: Neurosurgery

## 2022-07-07 ENCOUNTER — Ambulatory Visit (INDEPENDENT_AMBULATORY_CARE_PROVIDER_SITE_OTHER): Payer: PRIVATE HEALTH INSURANCE | Admitting: Neurosurgery

## 2022-07-07 VITALS — BP 138/78 | Ht 72.0 in | Wt 298.0 lb

## 2022-07-07 DIAGNOSIS — M5416 Radiculopathy, lumbar region: Secondary | ICD-10-CM | POA: Diagnosis not present

## 2022-07-07 DIAGNOSIS — M48062 Spinal stenosis, lumbar region with neurogenic claudication: Secondary | ICD-10-CM

## 2022-07-07 NOTE — Patient Instructions (Signed)
Please see below for information in regards to your upcoming surgery:  Planned surgery: Left L3-S1 decompression   Surgery date: 08/03/22 - you will find out your arrival time the business day before your surgery.   Hold Empagliflozin-metFORMIN HCl [facility]) for 3 days prior to surgery  Cosentyx: ok to take August dose, skip September dose (restart 4 weeks after surgery)   Pre-op appointment at [place]: we will call you with a date/time for this. Pre-admit testing is located on the first floor of the [facility], [place], [address].   Pre-op labs may be done at your pre-op appointment. You are not required to fast for these labs.    Should you need to change your pre-op appointment, please call Pre-admit testing at [phone].     If you have FMLA/disability paperwork, please drop it off or fax it to [phone], attention [place].   If you have any questions/concerns before or after surgery, you can reach [place] at [phone], or you can send a mychart message. If you have a concern after hours that cannot wait until normal business hours, you can call [phone] or [phone] and ask the answering service to page the neurosurgeon on call.   Appointments/FMLA & disability paperwork: [place] Nurse: Ophelia Shoulder  Medical assistant: Raquel Sarna Physician Assistant's: Cooper Render & Geronimo Boot Surgeon: Meade Maw, MD

## 2022-07-11 ENCOUNTER — Other Ambulatory Visit: Payer: Self-pay

## 2022-07-11 DIAGNOSIS — Z01818 Encounter for other preprocedural examination: Secondary | ICD-10-CM

## 2022-07-20 ENCOUNTER — Encounter: Payer: Self-pay | Admitting: Neurosurgery

## 2022-07-20 ENCOUNTER — Encounter
Admission: RE | Admit: 2022-07-20 | Discharge: 2022-07-20 | Disposition: A | Discharge status: Home / Self Care | Payer: PRIVATE HEALTH INSURANCE | Source: Ambulatory Visit | Attending: Neurosurgery | Admitting: Neurosurgery

## 2022-07-20 VITALS — BP 140/75 | HR 70 | Temp 97.5°F | Resp 18 | Ht 72.0 in | Wt 298.7 lb

## 2022-07-20 DIAGNOSIS — E1159 Type 2 diabetes mellitus with other circulatory complications: Secondary | ICD-10-CM | POA: Diagnosis not present

## 2022-07-20 DIAGNOSIS — Z01818 Encounter for other preprocedural examination: Secondary | ICD-10-CM | POA: Diagnosis present

## 2022-07-20 DIAGNOSIS — I4892 Unspecified atrial flutter: Secondary | ICD-10-CM | POA: Insufficient documentation

## 2022-07-20 DIAGNOSIS — I152 Hypertension secondary to endocrine disorders: Secondary | ICD-10-CM | POA: Diagnosis not present

## 2022-07-20 DIAGNOSIS — E1142 Type 2 diabetes mellitus with diabetic polyneuropathy: Secondary | ICD-10-CM | POA: Diagnosis not present

## 2022-07-20 DIAGNOSIS — Z01812 Encounter for preprocedural laboratory examination: Secondary | ICD-10-CM

## 2022-07-20 LAB — CBC
HCT: 50.5 % (ref 39.0–52.0)
Hemoglobin: 16.9 g/dL (ref 13.0–17.0)
MCH: 32.2 pg (ref 26.0–34.0)
MCHC: 33.5 g/dL (ref 30.0–36.0)
MCV: 96.2 fL (ref 80.0–100.0)
Platelets: 127 10*3/uL — ABNORMAL LOW (ref 150–400)
RBC: 5.25 MIL/uL (ref 4.22–5.81)
RDW: 13.3 % (ref 11.5–15.5)
WBC: 7.3 10*3/uL (ref 4.0–10.5)
nRBC: 0 % (ref 0.0–0.2)

## 2022-07-20 LAB — SURGICAL PCR SCREEN
MRSA, PCR: NEGATIVE
Staphylococcus aureus: POSITIVE — AB

## 2022-07-20 LAB — URINALYSIS, ROUTINE W REFLEX MICROSCOPIC
Bacteria, UA: NONE SEEN
Bilirubin Urine: NEGATIVE
Glucose, UA: >=500 mg/dL — AB
Hgb urine dipstick: NEGATIVE
Ketones, ur: NEGATIVE mg/dL
Leukocytes,Ua: NEGATIVE
Nitrite: NEGATIVE
Protein, ur: NEGATIVE mg/dL
Specific Gravity, Urine: 1.02 (ref 1.005–1.030)
pH: 5 (ref 5.0–8.0)

## 2022-07-20 LAB — BASIC METABOLIC PANEL
Anion gap: 7 (ref 5–15)
BUN: 28 mg/dL — ABNORMAL HIGH (ref 6–20)
CO2: 24 mmol/L (ref 22–32)
Calcium: 9.5 mg/dL (ref 8.9–10.3)
Chloride: 104 mmol/L (ref 98–111)
Creatinine, Ser: 1.28 mg/dL — ABNORMAL HIGH (ref 0.61–1.24)
GFR, Estimated: >60 mL/min (ref >60)
Glucose, Bld: 158 mg/dL — ABNORMAL HIGH (ref 70–99)
Potassium: 5 mmol/L (ref 3.5–5.1)
Sodium: 135 mmol/L (ref 135–145)

## 2022-07-20 LAB — TYPE AND SCREEN
ABO/RH(D): A POS
Antibody Screen: NEGATIVE

## 2022-07-20 NOTE — Patient Instructions (Addendum)
Your procedure is scheduled on: Wednesday August 03, 2022. Report to Day Surgery inside [facility] 2nd floor, stop by admissions desk before getting on elevator. To find out your arrival time please call [phone] between 1PM - 3PM on Tuesday August 02, 2022.  Remember: Instructions that are not followed completely may result in serious medical risk,  up to and including death, or upon the discretion of your surgeon and anesthesiologist your  surgery may need to be rescheduled.     _X__ 1. Do not eat food after midnight the night before your procedure.                 No chewing gum or hard candies. You may drink clear liquids up to 2 hours                 before you are scheduled to arrive for your surgery- DO not drink clear                 liquids within 2 hours of the start of your surgery.                 Clear Liquids include:  water, clear Gatorade, G2 or                  Gatorade Zero (avoid Red/Purple/Blue), Black Coffee or Tea (Do not add                 anything to coffee or tea).  __X__2.  On the morning of surgery brush your teeth with toothpaste and water, you                may rinse your mouth with mouthwash if you wish.  Do not swallow any toothpaste or mouthwash.     _X__ 3.  No Alcohol for 24 hours before or after surgery.   _X__ 4.  Do Not Smoke or use e-cigarettes For 24 Hours Prior to Your Surgery.                 Do not use any chewable tobacco products for at least 6 hours prior to                 Surgery.  _X__  5.  Do not use any recreational drugs (marijuana, cocaine, heroin, ecstasy, MDMA or other)                For at least one week prior to your surgery.  Combination of these drugs with anesthesia                May have life threatening results.  ____  6.  Bring all medications with you on the day of surgery if instructed.   __X_ 7.  Notify your doctor if there is any change in your medical condition      (cold,  fever, infections).     Do not wear jewelry, make-up, hairpins, clips or nail polish. Do not wear lotions, powders, or perfumes. You may wear deodorant. Do not shave 48 hours prior to surgery. Men may shave face and neck. Do not bring valuables to the hospital.    [facility] is not responsible for any belongings or valuables.  Contacts, dentures or bridgework may not be worn into surgery. Leave your suitcase in the car. After surgery it may be brought to your room. For patients admitted to the hospital, discharge time is determined by your treatment team.  Patients discharged the day of surgery will not be allowed to drive home.   Make arrangements for someone to be with you for the first 24 hours of your Same Day Discharge.   __X__ Take these medicines the morning of surgery with A SIP OF WATER:    1. metoprolol succinate (TOPROL-XL) 50 MG  2. fexofenadine (ALLEGRA) 180 MG tablet  3. gabapentin (NEURONTIN) 300 MG capsule  4. rosuvastatin (CRESTOR) 10 MG   5.  6.  ____ Fleet Enema (as directed)   __X__ Use CHG Soap (or wipes) as directed  ____ Use Benzoyl Peroxide Gel as instructed  ____ Use inhalers on the day of surgery  __X__ Stop Empagliflozin-metFORMIN HCl (SYNJARDY) 12.03-999 MG 3 days prior to surgery  __X__ Stop Semaglutide,0.25 or 0.'5MG'$ /DOS, (OZEMPIC, 0.25 OR 0.5 MG/DOSE,) 2 MG/3ML SOPN Take last dose 07/24/22    ____ Take 1/2 of usual insulin dose the night before surgery. No insulin the morning          of surgery.  __X__ Cosentyx: ok to take August dose, skip September dose (restart 4 weeks after surgery as instructed by your surgeon.  ____ Call your PCP, cardiologist, or Pulmonologist if taking Coumadin/Plavix/aspirin and ask when to stop before your surgery.   __X__ One Week prior to surgery- Stop Anti-inflammatories such as Ibuprofen, Aleve, Advil, Motrin, meloxicam (MOBIC), diclofenac, etodolac, ketorolac, Toradol, Daypro, piroxicam, Goody's or BC  powders. OK TO USE TYLENOL IF NEEDED   _X___Do not start any new vitamins and or supplements until after surgery.    __X__ Bring C-Pap to the hospital.    If you have any questions regarding your pre-procedure instructions,  Please call Pre-admit Testing at [phone]

## 2022-07-21 ENCOUNTER — Inpatient Hospital Stay: Admission: RE | Admit: 2022-07-21 | Payer: PRIVATE HEALTH INSURANCE | Source: Ambulatory Visit

## 2022-07-21 NOTE — Progress Notes (Signed)
  Perioperative Services Pre-Admission/Anesthesia Testing    Date: 07/21/22  Name: Cory Perez MRN:   [id number]  Re: GLP-1 clearance and provider recommendations   Planned Surgical Procedure(s):    Case: [id number] Date/Time: 08/03/22 5885   Procedure: LEFT L3-S1 DECOMPRESSION (Left)   Anesthesia type: General   Pre-op diagnosis:      Lumbar radiculopathy M54.16     Neurogenic claudication due to lumbar spinal stenosis M48.062   Location: [facility] OR ROOM 03 / [place] [facility]S FOR AN[facility]   Surgeons: Meade Maw, MD   Clinical Notes:  Patient is scheduled for the above procedure on 08/03/2022 with Dr. Meade Maw, MD. In review of his medication reconciliation it was noted that patient is on a prescribed GLP-1 medication. Per guidelines issued by the American Society of Anesthesiologists (ASA), it is recommended that these medications be held for 7 days prior to the patient undergoing any type of elective surgical procedure. The patient is taking the following GLP-1 medication:  '[x]'$  SEMAGLUTIDE (Ozempic, Rybelsus)  '[]'$  EXENATIDE (Bydureon, Byetta) '[]'$  LIRAGLUTIDE (Victoza, Saxenda)  '[]'$  LIXISENATIDE (Adalyxin) '[]'$  DULAGLUTIDE (Trulicity)    '[]'$  OTHER GLP-1 medication: _______________  Reached out to prescribing provider Cory Junes, MD) to make them aware of the guidelines from anesthesia. Given that this patient takes the prescribed GLP-1 medication for his  diabetes diagnosis, rather than for weight loss, recommendations from the prescribing provider were solicited. Prescribing provider made aware of the following so that informed decision/POC can be developed for this patient that may be taking medications belonging to these drug classes:  Oral GLP-1 medications will be held 1 day prior to surgery.  Injectable GLP-1 medications will be held 7 days prior to surgery.  Metformin is routinely held 48 hours prior to surgery due to renal concerns, potential need for contrasted  imaging perioperatively, and the potential for tissue hypoxia leading to drug induced lactic acidosis.  All SGLT2i medications are held 72 hours prior to surgery as they can be associated with the increased potential for developing euglycemic diabetic ketoacidosis (EDKA).   Impression and Plan:  Cory Perez is on a prescribed GLP-1 medication, which induces the known side effect of decreased gastric emptying. Efforts are bring made to mitigate the risk of perioperative hyperglycemic events, as elevated blood glucose levels have been found to contribute to intra/postoperative complications. Additionally, hyperglycemic extremes can potentially necessitate the postponing of a patient's elective case in order to better optimize perioperative glycemic control, again with the aforementioned guidelines in place. With this in mind, recommendations have been sought from the prescribing provider, who has cleared patient to proceed with holding the prescribed GLP-1 as per the guidelines from the ASA.   Provider recommending: no further recommendations received from the prescribing provider.  Copy of signed clearance and recommendations placed on patient's chart for inclusion in their medical record and for review by the surgical/anesthetic team on the day of his procedure.   Cory Loh, MSN, APRN, FNP-C, CEN [facility]  Peri-operative Services Nurse Practitioner Phone: [phone] 07/21/22 9:23 AM  NOTE: This note has been prepared using Dragon dictation software. Despite my best ability to proofread, there is always the potential that unintentional transcriptional errors may still occur from this process.

## 2022-07-27 ENCOUNTER — Encounter: Payer: Self-pay | Admitting: Neurosurgery

## 2022-07-27 NOTE — Progress Notes (Signed)
Perioperative Services  Pre-Admission/Anesthesia Testing Clinical Review  Date: 08/02/22  Patient Demographics:  Name: Cory Perez DOB:   [birth year] MRN:   [id number]  Planned Surgical Procedure(s):    Case: [id number] Date/Time: 08/03/22 0937   Procedure: LEFT L3-S1 DECOMPRESSION (Left)   Anesthesia type: General   Pre-op diagnosis:      Lumbar radiculopathy M54.16     Neurogenic claudication due to lumbar spinal stenosis M48.062   Location: [facility] OR ROOM 03 / [facility]   Surgeons: Meade Maw, MD   NOTE: Available PAT nursing documentation and vital signs have been reviewed. Clinical nursing staff has updated patient's PMH/PSHx, current medication list, and drug allergies/intolerances to ensure comprehensive history available to assist in medical decision making as it pertains to the aforementioned surgical procedure and anticipated anesthetic course. Extensive review of available clinical information performed. Cory Perez PMH and PSHx updated with any diagnoses/procedures that  may have been inadvertently omitted during his intake with the pre-admission testing department's nursing staff.  Clinical Discussion:  Cory Perez is a 56 y.o. male who is submitted for pre-surgical anesthesia review and clearance prior to him undergoing the above procedure. Patient has never been a smoker. Pertinent PMH includes: atrial fibrillation/flutter (s/p ablation), angina, diastolic dysfunction, HTN, HLD, T2DM, OSAH (requires nocturnal PAP therapy), Conn syndrome, lumbar spinal stenosis, DJD, anxiety, depression.  Patient is followed by cardiology Cory Massed, MD). He was last seen in the cardiology clinic on 01/18/2022; notes reviewed.  At the time of this clinic visit, patient doing well overall from a cardiovascular perspective.  He denied any episodes of chest pain, however continued to experience episodes of shortness of breath.  He denied any PND, orthopnea, palpitations,  significant peripheral edema, vertiginous symptoms, or presyncope/syncope.  Patient with a past medical history significant for cardiovascular diagnoses.  Most recent TTE was performed on 12/02/2021 revealing a normal left ventricular systolic function with an EF of >55%.  There was moderate left atrial enlargement.  Trivial to mild mitral, tricuspid, and pulmonary valve regurgitation noted.  There was no evidence of a significant transvalvular gradient to suggest stenosis.  Myocardial perfusion imaging study performed on 12/03/2021 revealed a normal left ventricular systolic function with EF 59%.  There was normal myocardial thickening and wall motion.  No artifact is noted.  Left ventricular cavity size normal.  There was no evidence of stress-induced myocardial ischemia or arrhythmia; no scintigraphic evidence of scar. Study determined to be normal and low risk.  Patient with an atrial fibrillation/flutter diagnosis; CHA2DS2-VASc Score = 2 (HTN, T2DM).patient underwent cardiac ablation procedure on 07/13/2016 His rate and rhythm are currently being maintained on oral metoprolol succinate.  Patient is not currently taking any type of chronic anticoagulation therapies.  Blood pressure well controlled at 110/60 on currently prescribed ACEi (lisinopril) and beta-blocker (metoprolol succinate) therapies. He is on a rosuvastatin for his HLD diagnosis and further ASCVD prevention. T2DM reasonably controlled on currently prescribed regimen; last HgbA1c was 7.1% when checked on 03/23/2022.  Of note, patient on an SGLT2i (empagliflozin) in the setting of T2DM and known cardiovascular diagnoses.  Patient does have an OSAH diagnosis and is reported to be compliant with prescribed nocturnal PAP therapy.  Functional capacity somewhat limited by shortness of breath, however patient felt to be able to achieve at least 4 METS of activity without experiencing any angina/anginal equivalent symptoms.  No changes were made to  his medication regimen.  Patient to follow-up with outpatient cardiology in 9 months or  sooner if needed.  Cory Perez is scheduled for an LEFT L3-S1 DECOMPRESSION on 08/03/2022 with Dr. Meade Maw, MD. Given patient's past medical history significant for cardiovascular diagnoses, presurgical cardiac clearance was sought by the PAT team. Per cardiology, "this patient is optimized for surgery and may proceed with the planned procedural course with a LOW risk of significant perioperative cardiovascular complications". In review of his medication reconciliation, the patient is not noted to be taking any type of anticoagulation or antiplatelet therapies that would need to be held during his perioperative course.  Patient denies previous perioperative complications with anesthesia in the past. In review of the available records, it is noted that patient underwent a general anesthetic course here at [facility] (ASA III) in 12/2020 without documented complications.      07/20/2022    8:10 AM 07/07/2022    9:02 AM 07/31/2021    5:52 PM  Vitals with BMI  Height '6\' 0"'$  '6\' 0"'$    Weight 298 lbs 11 oz 298 lbs   BMI 08.6 57.84   Systolic 696 295 284  Diastolic 75 78 79  Pulse 70  75    Providers/Specialists:   NOTE: Primary physician provider listed below. Patient may have been seen by APP or partner within same practice.   PROVIDER ROLE / SPECIALTY LAST Dola Factor, MD Neurosurgery (Surgeon) 07/07/2022  Baxter Hire, MD Primary Care Provider 04/19/2022  Serafina Royals, MD Cardiology 01/18/2022  Mee Hives, MD Endocrinology 03/23/2022  Sharlet Salina, MD Physiatry 05/27/2022   Allergies:  Ketoconazole  Current Home Medications:   No current facility-administered medications for this encounter.    acetaminophen (TYLENOL) 500 MG tablet   baclofen (LIORESAL) 10 MG tablet   cholecalciferol (VITAMIN D) 25 MCG (1000 UT) tablet    Empagliflozin-metFORMIN HCl (SYNJARDY) 12.03-999 MG TABS   fexofenadine (ALLEGRA) 180 MG tablet   fluticasone (FLONASE) 50 MCG/ACT nasal spray   gabapentin (NEURONTIN) 300 MG capsule   glimepiride (AMARYL) 2 MG tablet   Insulin Pen Needle 31G X 5 MM MISC   lisinopril (ZESTRIL) 5 MG tablet   meloxicam (MOBIC) 15 MG tablet   metoprolol succinate (TOPROL-XL) 50 MG 24 hr tablet   pioglitazone (ACTOS) 15 MG tablet   rosuvastatin (CRESTOR) 10 MG tablet   Secukinumab (COSENTYX, 300 MG DOSE, [place])   Semaglutide,0.25 or 0.'5MG'$ /DOS, (OZEMPIC, 0.25 OR 0.5 MG/DOSE,) 2 MG/3ML SOPN   sertraline (ZOLOFT) 100 MG tablet   SOOLANTRA 1 % CREA   vitamin B-12 (CYANOCOBALAMIN) 1000 MCG tablet   History:   Past Medical History:  Diagnosis Date   Abnormal results of liver function studies    Aldosterone excess (Conn syndrome) (HCC)    Anginal pain (HCC)    Anxiety    Atrial fibrillation and flutter (HCC)    a.) CHA2DS2VASc = 2 (HTN, T2DM);  b.) s/p ablation 07/13/2016; c.) rate/rhythm maintained on oral metoprolol succinate; no chronic anticoagulation   Chronic seasonal allergic rhinitis    Depression    Diastolic dysfunction 13/24/4010   a.) TTE 10/21/2020: EF >55%, mild LVH, mod BAE, triv AR/MR, mild TR/PR, G2DD   DJD (degenerative joint disease)    DM2 (diabetes mellitus, type 2) (HCC)    History of chicken pox    History of syncope    HLD (hyperlipidemia)    HTN (hypertension)    Hx of atrioventricular node ablation 07/13/2016   Hypersomnia with sleep apnea    a.) on nocturnal PAP therapy  Lumbar spinal stenosis    Psoriasis    Seasonal allergies    Seborrheic dermatitis    Past Surgical History:  Procedure Laterality Date   CARDIAC ELECTROPHYSIOLOGY MAPPING AND ABLATION N/A 07/13/2016   COLONOSCOPY  12/05/2003, 09/05/2002   Dr. Chauncey Cruel. [place] [facility] Adenomatous Polyps, [facility] (sister)   COLONOSCOPY WITH PROPOFOL N/A 12/25/2020   Procedure: COLONOSCOPY WITH PROPOFOL;  Surgeon: Robert Bellow, MD;  Location: [facility] ENDOSCOPY;  Service: Endoscopy;  Laterality: N/A;  COVID POSITIVE ON 11/24/2020   LAPAROSCOPIC ADRENALECTOMY Right 03/10/2016   LUMBAR LAMINECTOMY/DECOMPRESSION MICRODISCECTOMY N/A 03/06/2019   Procedure: LUMBAR LAMINECTOMY/DECOMPRESSION MICRODISCECTOMY 2 LEVELS L3-4; L4-5;  Surgeon: Meade Maw, MD;  Location: [facility] ORS;  Service: Neurosurgery;  Laterality: N/A;   MEDIAL PARTIAL KNEE REPLACEMENT Left 02/12/2014   Procecedure: MEDIAL MAKOPLASTY   TONSILLECTOMY     UVULOPALATOPHARYNGOPLASTY N/A    VASECTOMY     No family history on file. Social History   Tobacco Use   Smoking status: Never   Smokeless tobacco: Never   Tobacco comments:    only smoked for a week  Vaping Use   Vaping Use: Never used  Substance Use Topics   Drug use: Never    Pertinent Clinical Results:  LABS: Labs reviewed: Acceptable for surgery.  Hospital Outpatient Visit on 07/20/2022  Component Date Value Ref Range Status   WBC 07/20/2022 7.3  4.0 - 10.5 K/uL Final   RBC 07/20/2022 5.25  4.22 - 5.81 MIL/uL Final   Hemoglobin 07/20/2022 16.9  13.0 - 17.0 g/dL Final   HCT 07/20/2022 50.5  39.0 - 52.0 % Final   MCV 07/20/2022 96.2  80.0 - 100.0 fL Final   MCH 07/20/2022 32.2  26.0 - 34.0 pg Final   MCHC 07/20/2022 33.5  30.0 - 36.0 g/dL Final   RDW 07/20/2022 13.3  11.5 - 15.5 % Final   Platelets 07/20/2022 127 (L)  150 - 400 K/uL Final   nRBC 07/20/2022 0.0  0.0 - 0.2 % Final   Performed at [facility], [place], [place] [address]   Sodium 07/20/2022 135  135 - 145 mmol/L Final   Potassium 07/20/2022 5.0  3.5 - 5.1 mmol/L Final   Chloride 07/20/2022 104  98 - 111 mmol/L Final   CO2 07/20/2022 24  22 - 32 mmol/L Final   Glucose, Bld 07/20/2022 158 (H)  70 - 99 mg/dL Final   Glucose reference range applies only to samples taken after fasting for at least 8 hours.   BUN 07/20/2022 28 (H)  6 - 20 mg/dL Final   Creatinine, Ser 07/20/2022 1.28 (H)   0.61 - 1.24 mg/dL Final   Calcium 07/20/2022 9.5  8.9 - 10.3 mg/dL Final   GFR, Estimated 07/20/2022 >60  >60 mL/min Final   Comment: (NOTE) Calculated using the CKD-EPI Creatinine Equation (2021)    Anion gap 07/20/2022 7  5 - 15 Final   Performed at [facility], [address] [place], [place] [address]   ABO/RH(D) 07/20/2022 A POS   Final   Antibody Screen 07/20/2022 NEG   Final   Sample Expiration 07/20/2022 08/03/2022,2359   Final   Extend sample reason 07/20/2022    Final                   Value:NO TRANSFUSIONS OR PREGNANCY IN THE PAST 3 MONTHS Performed at [facility], [address] [place], [place] [address]    Color, Urine 07/20/2022 STRAW (  A)  YELLOW Final   APPearance 07/20/2022 CLEAR (A)  CLEAR Final   Specific Gravity, Urine 07/20/2022 1.020  1.005 - 1.030 Final   pH 07/20/2022 5.0  5.0 - 8.0 Final   Glucose, UA 07/20/2022 >=500 (A)  NEGATIVE mg/dL Final   Hgb urine dipstick 07/20/2022 NEGATIVE  NEGATIVE Final   Bilirubin Urine 07/20/2022 NEGATIVE  NEGATIVE Final   Ketones, ur 07/20/2022 NEGATIVE  NEGATIVE mg/dL Final   Protein, ur 07/20/2022 NEGATIVE  NEGATIVE mg/dL Final   Nitrite 07/20/2022 NEGATIVE  NEGATIVE Final   Leukocytes,Ua 07/20/2022 NEGATIVE  NEGATIVE Final   RBC / HPF 07/20/2022 0-5  0 - 5 RBC/hpf Final   WBC, UA 07/20/2022 0-5  0 - 5 WBC/hpf Final   Bacteria, UA 07/20/2022 NONE SEEN  NONE SEEN Final   Squamous Epithelial / LPF 07/20/2022 0-5  0 - 5 Final   Mucus 07/20/2022 PRESENT   Final   Performed at [facility], [address] [place], [place] [address]   MRSA, PCR 07/20/2022 NEGATIVE  NEGATIVE Final   Staphylococcus aureus 07/20/2022 POSITIVE (A)  NEGATIVE Final   Comment: (NOTE) The Xpert SA Assay (FDA approved for NASAL specimens in patients 75 years of age and older), is one component of a comprehensive surveillance program. It is not intended to diagnose infection nor to guide or monitor  treatment. Performed at [facility], [address] [place], [place] [address]     ECG: Date: 07/20/2022 Time ECG obtained: 0851 AM Rate: 72 bpm Rhythm: atrial fibrillation Axis (leads I and aVF): Normal Intervals: QRS 94 ms. QTc 440 ms. ST segment and T wave changes: No evidence of acute ST segment elevation or depression Comparison: Similar to previous tracing obtained on 07/31/2021   IMAGING / PROCEDURES: MRI LUMBAR SPINE WO CONTRAST performed on 04/06/2022 Interval laminectomy on the right at L3-4 with improvement in spinal stenosis which is now moderately severe. Moderate to severe subarticular stenosis bilaterally similar. Interval right laminectomy L4-5 with improvement in spinal stenosis which is now moderately severe. Moderate subarticular and foraminal stenosis left greater than right similar to the prior study. Left S1 nerve root impingement in the subarticular zone at L5-S1. This appears to be due to a posterior epidural process such as synovial cyst measuring approximately 5 mm.  MYOCARDIAL PERFUSION IMAGING STUDY (LEXISCAN) performed on 12/03/2021 Normal left ventricular systolic function with a normal LVEF of 59% Normal myocardial thickening and wall motion Left ventricular cavity size normal SPECT images demonstrate homogenous tracer distribution throughout the myocardium No evidence of stress-induced myocardial ischemia or arrhythmia Normal low risk study  TRANSTHORACIC ECHOCARDIOGRAM performed on 12/02/2021 Normal left ventricular systolic function with an EF of >55% Moderately enlarged left atrium Trivial MR and TR Mild PR  No AR Normal gradients; no valvular stenosis No pericardial effusion  Impression and Plan:  Cory Perez has been referred for pre-anesthesia review and clearance prior to him undergoing the planned anesthetic and procedural courses. Available labs, pertinent testing, and imaging results were personally reviewed by me. This  patient has been appropriately cleared by cardiology with an overall LOW risk of significant perioperative cardiovascular complications.  Based on clinical review performed today (08/02/22), barring any significant acute changes in the patient's overall condition, it is anticipated that he will be able to proceed with the planned surgical intervention. Any acute changes in clinical condition may necessitate his procedure being postponed and/or cancelled. Patient will meet with anesthesia team (MD and/or CRNA) on the day of  his procedure for preoperative evaluation/assessment. Questions regarding anesthetic course will be fielded at that time.   Pre-surgical instructions were reviewed with the patient during his PAT appointment and questions were fielded by PAT clinical staff. Patient was advised that if any questions or concerns arise prior to his procedure then he should return a call to PAT and/or his surgeon's office to discuss.  Honor Loh, MSN, APRN, FNP-C, CEN [facility]  Peri-operative Services Nurse Practitioner Phone: [phone] Fax: [phone] 08/02/22 6:45 AM  NOTE: This note has been prepared using Dragon dictation software. Despite my best ability to proofread, there is always the potential that unintentional transcriptional errors may still occur from this process.

## 2022-08-01 ENCOUNTER — Encounter: Payer: Self-pay | Admitting: Urgent Care

## 2022-08-02 MED ORDER — SODIUM CHLORIDE 0.9 % IV SOLN: INTRAVENOUS | Status: DC

## 2022-08-02 MED ORDER — ORAL CARE MOUTH RINSE: 15.0000 mL | Freq: Once | OROMUCOSAL | Status: AC

## 2022-08-02 MED ORDER — CEFAZOLIN IN SODIUM CHLORIDE 2-0.9 GM/100ML-% IV SOLN
3.0000 g | Freq: Once | INTRAVENOUS | Status: AC
  Administered 2022-08-03: 3 g via INTRAVENOUS
  Filled 2022-08-02: qty 200

## 2022-08-02 MED ORDER — CHLORHEXIDINE GLUCONATE 0.12 % MT SOLN
15.0000 mL | Freq: Once | OROMUCOSAL | Status: AC
  Administered 2022-08-03: 15 mL via OROMUCOSAL

## 2022-08-02 MED ORDER — FAMOTIDINE 20 MG PO TABS
20.0000 mg | ORAL_TABLET | Freq: Once | ORAL | Status: AC
  Administered 2022-08-03: 20 mg via ORAL

## 2022-08-03 ENCOUNTER — Ambulatory Visit: Payer: PRIVATE HEALTH INSURANCE

## 2022-08-03 ENCOUNTER — Other Ambulatory Visit: Payer: Self-pay

## 2022-08-03 ENCOUNTER — Observation Stay
Admission: RE | Admit: 2022-08-03 | Discharge: 2022-08-04 | Disposition: A | Discharge status: Home / Self Care | Payer: PRIVATE HEALTH INSURANCE | Attending: Neurosurgery | Admitting: Neurosurgery

## 2022-08-03 ENCOUNTER — Encounter
Admission: RE | Disposition: A | Discharge status: Home / Self Care | Payer: Self-pay | Source: Home / Self Care | Attending: Neurosurgery

## 2022-08-03 ENCOUNTER — Ambulatory Visit: Payer: PRIVATE HEALTH INSURANCE | Admitting: Urgent Care

## 2022-08-03 ENCOUNTER — Encounter: Payer: Self-pay | Admitting: Neurosurgery

## 2022-08-03 DIAGNOSIS — M5416 Radiculopathy, lumbar region: Secondary | ICD-10-CM

## 2022-08-03 DIAGNOSIS — E119 Type 2 diabetes mellitus without complications: Secondary | ICD-10-CM | POA: Diagnosis not present

## 2022-08-03 DIAGNOSIS — Z01818 Encounter for other preprocedural examination: Secondary | ICD-10-CM

## 2022-08-03 DIAGNOSIS — R29818 Other symptoms and signs involving the nervous system: Secondary | ICD-10-CM | POA: Diagnosis present

## 2022-08-03 DIAGNOSIS — M48062 Spinal stenosis, lumbar region with neurogenic claudication: Secondary | ICD-10-CM | POA: Diagnosis not present

## 2022-08-03 DIAGNOSIS — Z96652 Presence of left artificial knee joint: Secondary | ICD-10-CM | POA: Diagnosis not present

## 2022-08-03 DIAGNOSIS — E1142 Type 2 diabetes mellitus with diabetic polyneuropathy: Secondary | ICD-10-CM

## 2022-08-03 DIAGNOSIS — I10 Essential (primary) hypertension: Secondary | ICD-10-CM | POA: Diagnosis not present

## 2022-08-03 HISTORY — PX: LUMBAR LAMINECTOMY/DECOMPRESSION MICRODISCECTOMY: SHX5026

## 2022-08-03 HISTORY — DX: Angina pectoris, unspecified: I20.9

## 2022-08-03 HISTORY — DX: Unspecified atrial fibrillation: I48.91

## 2022-08-03 HISTORY — DX: Other seasonal allergic rhinitis: J30.2

## 2022-08-03 LAB — GLUCOSE, CAPILLARY
Glucose-Capillary: 167 mg/dL — ABNORMAL HIGH (ref 70–99)
Glucose-Capillary: 161 mg/dL — ABNORMAL HIGH (ref 70–99)
Glucose-Capillary: 204 mg/dL — ABNORMAL HIGH (ref 70–99)
Glucose-Capillary: 227 mg/dL — ABNORMAL HIGH (ref 70–99)

## 2022-08-03 LAB — ABO/RH: ABO/RH(D): A POS

## 2022-08-03 SURGERY — LUMBAR LAMINECTOMY/DECOMPRESSION MICRODISCECTOMY 3 LEVELS
Anesthesia: General | Laterality: Left

## 2022-08-03 MED ORDER — OXYCODONE HCL 5 MG PO TABS: 5.0000 mg | ORAL_TABLET | Freq: Once | ORAL | Status: DC | PRN

## 2022-08-03 MED ORDER — PROMETHAZINE HCL 25 MG/ML IJ SOLN: 6.2500 mg | INTRAMUSCULAR | Status: DC | PRN

## 2022-08-03 MED ORDER — PHENYLEPHRINE HCL-NACL 20-0.9 MG/250ML-% IV SOLN
INTRAVENOUS | Status: AC
  Filled 2022-08-03: qty 250

## 2022-08-03 MED ORDER — MORPHINE SULFATE (PF) 2 MG/ML IV SOLN: 2.0000 mg | INTRAVENOUS | Status: DC | PRN

## 2022-08-03 MED ORDER — DEXAMETHASONE SODIUM PHOSPHATE 10 MG/ML IJ SOLN
INTRAMUSCULAR | Status: DC | PRN
  Administered 2022-08-03: 10 mg via INTRAVENOUS

## 2022-08-03 MED ORDER — EPHEDRINE SULFATE (PRESSORS) 50 MG/ML IJ SOLN
INTRAMUSCULAR | Status: DC | PRN
  Administered 2022-08-03 (×2): 5 mg via INTRAVENOUS

## 2022-08-03 MED ORDER — FENTANYL CITRATE (PF) 100 MCG/2ML IJ SOLN
INTRAMUSCULAR | Status: AC
  Filled 2022-08-03: qty 2

## 2022-08-03 MED ORDER — BUPIVACAINE HCL (PF) 0.5 % IJ SOLN
INTRAMUSCULAR | Status: AC
  Filled 2022-08-03: qty 30

## 2022-08-03 MED ORDER — DEXAMETHASONE SODIUM PHOSPHATE 10 MG/ML IJ SOLN
INTRAMUSCULAR | Status: AC
  Filled 2022-08-03: qty 1

## 2022-08-03 MED ORDER — CEFAZOLIN SODIUM 1 G IJ SOLR
INTRAMUSCULAR | Status: AC
  Filled 2022-08-03: qty 10

## 2022-08-03 MED ORDER — SODIUM CHLORIDE 0.9% FLUSH: 3.0000 mL | INTRAVENOUS | Status: DC | PRN

## 2022-08-03 MED ORDER — REMIFENTANIL HCL 1 MG IV SOLR
INTRAVENOUS | Status: DC | PRN
  Administered 2022-08-03: .1 ug/kg/min via INTRAVENOUS

## 2022-08-03 MED ORDER — SODIUM CHLORIDE 0.9 % IV SOLN: INTRAVENOUS | Status: DC

## 2022-08-03 MED ORDER — KETOROLAC TROMETHAMINE 30 MG/ML IJ SOLN
INTRAMUSCULAR | Status: AC
  Filled 2022-08-03: qty 1

## 2022-08-03 MED ORDER — BUPIVACAINE-EPINEPHRINE (PF) 0.5% -1:200000 IJ SOLN
INTRAMUSCULAR | Status: AC
  Filled 2022-08-03: qty 30

## 2022-08-03 MED ORDER — SODIUM CHLORIDE 0.9 % IV SOLN
250.0000 mL | INTRAVENOUS | Status: DC
  Administered 2022-08-03: 250 mL via INTRAVENOUS

## 2022-08-03 MED ORDER — ACETAMINOPHEN 10 MG/ML IV SOLN
INTRAVENOUS | Status: DC | PRN
  Administered 2022-08-03: 1000 mg via INTRAVENOUS

## 2022-08-03 MED ORDER — LIDOCAINE HCL (PF) 2 % IJ SOLN
INTRAMUSCULAR | Status: AC
  Filled 2022-08-03: qty 5

## 2022-08-03 MED ORDER — ACETAMINOPHEN 10 MG/ML IV SOLN: 1000.0000 mg | Freq: Once | INTRAVENOUS | Status: DC | PRN

## 2022-08-03 MED ORDER — METHYLPREDNISOLONE ACETATE 40 MG/ML IJ SUSP
INTRAMUSCULAR | Status: AC
  Filled 2022-08-03: qty 1

## 2022-08-03 MED ORDER — ENOXAPARIN SODIUM 40 MG/0.4ML IJ SOSY
40.0000 mg | PREFILLED_SYRINGE | INTRAMUSCULAR | Status: DC
  Administered 2022-08-04: 40 mg via SUBCUTANEOUS
  Filled 2022-08-03: qty 0.4

## 2022-08-03 MED ORDER — METHYLPREDNISOLONE ACETATE 40 MG/ML IJ SUSP
INTRAMUSCULAR | Status: DC | PRN
  Administered 2022-08-03: 40 mg

## 2022-08-03 MED ORDER — SODIUM CHLORIDE (PF) 0.9 % IJ SOLN
INTRAMUSCULAR | Status: AC
  Filled 2022-08-03: qty 10

## 2022-08-03 MED ORDER — KETOROLAC TROMETHAMINE 30 MG/ML IJ SOLN
INTRAMUSCULAR | Status: DC | PRN
  Administered 2022-08-03: 15 mg via INTRAVENOUS

## 2022-08-03 MED ORDER — OXYCODONE HCL 5 MG PO TABS
5.0000 mg | ORAL_TABLET | ORAL | Status: DC | PRN
  Administered 2022-08-03: 5 mg via ORAL
  Filled 2022-08-03: qty 1

## 2022-08-03 MED ORDER — ACETAMINOPHEN 10 MG/ML IV SOLN
INTRAVENOUS | Status: AC
  Filled 2022-08-03: qty 100

## 2022-08-03 MED ORDER — DROPERIDOL 2.5 MG/ML IJ SOLN: 0.6250 mg | Freq: Once | INTRAMUSCULAR | Status: DC | PRN

## 2022-08-03 MED ORDER — GLIMEPIRIDE 2 MG PO TABS
2.0000 mg | ORAL_TABLET | Freq: Every day | ORAL | Status: DC
  Administered 2022-08-04: 2 mg via ORAL
  Filled 2022-08-03: qty 1

## 2022-08-03 MED ORDER — PHENYLEPHRINE HCL-NACL 20-0.9 MG/250ML-% IV SOLN
INTRAVENOUS | Status: DC | PRN
  Administered 2022-08-03: 20 ug/min via INTRAVENOUS

## 2022-08-03 MED ORDER — SUCCINYLCHOLINE CHLORIDE 200 MG/10ML IV SOSY
PREFILLED_SYRINGE | INTRAVENOUS | Status: DC | PRN
  Administered 2022-08-03: 140 mg via INTRAVENOUS

## 2022-08-03 MED ORDER — SODIUM CHLORIDE 0.9% FLUSH
3.0000 mL | Freq: Two times a day (BID) | INTRAVENOUS | Status: DC
  Administered 2022-08-03: 3 mL via INTRAVENOUS

## 2022-08-03 MED ORDER — LISINOPRIL 5 MG PO TABS
5.0000 mg | ORAL_TABLET | Freq: Every day | ORAL | Status: DC
  Administered 2022-08-03 – 2022-08-04 (×2): 5 mg via ORAL
  Filled 2022-08-03 (×2): qty 1

## 2022-08-03 MED ORDER — ONDANSETRON HCL 4 MG PO TABS: 4.0000 mg | ORAL_TABLET | Freq: Four times a day (QID) | ORAL | Status: DC | PRN

## 2022-08-03 MED ORDER — ORAL CARE MOUTH RINSE: 15.0000 mL | OROMUCOSAL | Status: DC | PRN

## 2022-08-03 MED ORDER — ROSUVASTATIN CALCIUM 10 MG PO TABS
10.0000 mg | ORAL_TABLET | Freq: Every day | ORAL | Status: DC
  Administered 2022-08-04: 10 mg via ORAL
  Filled 2022-08-03: qty 1

## 2022-08-03 MED ORDER — BUPIVACAINE-EPINEPHRINE (PF) 0.5% -1:200000 IJ SOLN
INTRAMUSCULAR | Status: DC | PRN
  Administered 2022-08-03: 7 mL

## 2022-08-03 MED ORDER — 0.9 % SODIUM CHLORIDE (POUR BTL) OPTIME
TOPICAL | Status: DC | PRN
  Administered 2022-08-03: 500 mL

## 2022-08-03 MED ORDER — METOPROLOL SUCCINATE ER 50 MG PO TB24
50.0000 mg | ORAL_TABLET | Freq: Every day | ORAL | Status: DC
  Administered 2022-08-04: 50 mg via ORAL
  Filled 2022-08-03: qty 1

## 2022-08-03 MED ORDER — BACLOFEN 10 MG PO TABS
10.0000 mg | ORAL_TABLET | Freq: Three times a day (TID) | ORAL | Status: DC
  Administered 2022-08-03 – 2022-08-04 (×3): 10 mg via ORAL
  Filled 2022-08-03 (×3): qty 1

## 2022-08-03 MED ORDER — FENTANYL CITRATE (PF) 100 MCG/2ML IJ SOLN
INTRAMUSCULAR | Status: DC | PRN
  Administered 2022-08-03: 50 ug via INTRAVENOUS

## 2022-08-03 MED ORDER — PROPOFOL 10 MG/ML IV BOLUS
INTRAVENOUS | Status: AC
  Filled 2022-08-03: qty 20

## 2022-08-03 MED ORDER — PIOGLITAZONE HCL 15 MG PO TABS
15.0000 mg | ORAL_TABLET | Freq: Every day | ORAL | Status: DC
  Administered 2022-08-03 – 2022-08-04 (×2): 15 mg via ORAL
  Filled 2022-08-03 (×2): qty 1

## 2022-08-03 MED ORDER — OXYCODONE HCL 5 MG PO TABS
10.0000 mg | ORAL_TABLET | ORAL | Status: DC | PRN
  Administered 2022-08-03 – 2022-08-04 (×3): 10 mg via ORAL
  Filled 2022-08-03 (×3): qty 2

## 2022-08-03 MED ORDER — OXYCODONE HCL 5 MG/5ML PO SOLN: 5.0000 mg | Freq: Once | ORAL | Status: DC | PRN

## 2022-08-03 MED ORDER — ONDANSETRON HCL 4 MG/2ML IJ SOLN
INTRAMUSCULAR | Status: DC | PRN
  Administered 2022-08-03: 4 mg via INTRAVENOUS

## 2022-08-03 MED ORDER — REMIFENTANIL HCL 1 MG IV SOLR: INTRAVENOUS | Status: DC | PRN

## 2022-08-03 MED ORDER — MIDAZOLAM HCL 2 MG/2ML IJ SOLN
INTRAMUSCULAR | Status: DC | PRN
  Administered 2022-08-03: 2 mg via INTRAVENOUS

## 2022-08-03 MED ORDER — CEFAZOLIN SODIUM-DEXTROSE 2-4 GM/100ML-% IV SOLN
INTRAVENOUS | Status: AC
  Filled 2022-08-03: qty 100

## 2022-08-03 MED ORDER — REMIFENTANIL HCL 1 MG IV SOLR
INTRAVENOUS | Status: AC
  Filled 2022-08-03: qty 1000

## 2022-08-03 MED ORDER — FENTANYL CITRATE (PF) 100 MCG/2ML IJ SOLN
25.0000 ug | INTRAMUSCULAR | Status: DC | PRN
  Administered 2022-08-03 (×2): 25 ug via INTRAVENOUS

## 2022-08-03 MED ORDER — GABAPENTIN 300 MG PO CAPS
300.0000 mg | ORAL_CAPSULE | Freq: Three times a day (TID) | ORAL | Status: DC
  Administered 2022-08-03 – 2022-08-04 (×3): 300 mg via ORAL
  Filled 2022-08-03 (×3): qty 1

## 2022-08-03 MED ORDER — SODIUM CHLORIDE (PF) 0.9 % IJ SOLN
INTRAMUSCULAR | Status: DC | PRN
  Administered 2022-08-03: 60 mL

## 2022-08-03 MED ORDER — CHLORHEXIDINE GLUCONATE 0.12 % MT SOLN
OROMUCOSAL | Status: AC
  Filled 2022-08-03: qty 15

## 2022-08-03 MED ORDER — BUPIVACAINE LIPOSOME 1.3 % IJ SUSP
INTRAMUSCULAR | Status: AC
  Filled 2022-08-03: qty 20

## 2022-08-03 MED ORDER — ACETAMINOPHEN 325 MG PO TABS
650.0000 mg | ORAL_TABLET | ORAL | Status: DC | PRN
  Administered 2022-08-03 – 2022-08-04 (×2): 650 mg via ORAL
  Filled 2022-08-03 (×2): qty 2

## 2022-08-03 MED ORDER — ONDANSETRON HCL 4 MG/2ML IJ SOLN: 4.0000 mg | Freq: Four times a day (QID) | INTRAMUSCULAR | Status: DC | PRN

## 2022-08-03 MED ORDER — FAMOTIDINE 20 MG PO TABS
ORAL_TABLET | ORAL | Status: AC
  Filled 2022-08-03: qty 1

## 2022-08-03 MED ORDER — PHENOL 1.4 % MT LIQD: 1.0000 | OROMUCOSAL | Status: DC | PRN

## 2022-08-03 MED ORDER — INSULIN ASPART 100 UNIT/ML IJ SOLN
0.0000 [IU] | Freq: Every day | INTRAMUSCULAR | Status: DC
  Administered 2022-08-03: 2 [IU] via SUBCUTANEOUS
  Filled 2022-08-03: qty 1

## 2022-08-03 MED ORDER — KETAMINE HCL 50 MG/5ML IJ SOSY
PREFILLED_SYRINGE | INTRAMUSCULAR | Status: AC
  Filled 2022-08-03: qty 5

## 2022-08-03 MED ORDER — ONDANSETRON HCL 4 MG/2ML IJ SOLN
INTRAMUSCULAR | Status: AC
  Filled 2022-08-03: qty 2

## 2022-08-03 MED ORDER — MELOXICAM 7.5 MG PO TABS
15.0000 mg | ORAL_TABLET | Freq: Every day | ORAL | Status: DC
  Administered 2022-08-03 – 2022-08-04 (×2): 15 mg via ORAL
  Filled 2022-08-03 (×2): qty 2

## 2022-08-03 MED ORDER — MIDAZOLAM HCL 2 MG/2ML IJ SOLN
INTRAMUSCULAR | Status: AC
  Filled 2022-08-03: qty 2

## 2022-08-03 MED ORDER — SERTRALINE HCL 50 MG PO TABS
200.0000 mg | ORAL_TABLET | Freq: Every day | ORAL | Status: DC
  Administered 2022-08-03: 200 mg via ORAL
  Filled 2022-08-03: qty 4

## 2022-08-03 MED ORDER — MENTHOL 3 MG MT LOZG: 1.0000 | LOZENGE | OROMUCOSAL | Status: DC | PRN

## 2022-08-03 MED ORDER — FLUTICASONE PROPIONATE 50 MCG/ACT NA SUSP: 2.0000 | Freq: Every evening | NASAL | Status: DC | PRN

## 2022-08-03 MED ORDER — PHENYLEPHRINE 80 MCG/ML (10ML) SYRINGE FOR IV PUSH (FOR BLOOD PRESSURE SUPPORT)
PREFILLED_SYRINGE | INTRAVENOUS | Status: AC
  Filled 2022-08-03: qty 10

## 2022-08-03 MED ORDER — ACETAMINOPHEN 650 MG RE SUPP: 650.0000 mg | RECTAL | Status: DC | PRN

## 2022-08-03 MED ORDER — SODIUM CHLORIDE FLUSH 0.9 % IV SOLN
INTRAVENOUS | Status: AC
  Filled 2022-08-03: qty 20

## 2022-08-03 MED ORDER — PROPOFOL 10 MG/ML IV BOLUS
INTRAVENOUS | Status: DC | PRN
  Administered 2022-08-03: 200 mg via INTRAVENOUS

## 2022-08-03 MED ORDER — KETAMINE HCL 10 MG/ML IJ SOLN
INTRAMUSCULAR | Status: DC | PRN
  Administered 2022-08-03: 50 mg via INTRAVENOUS

## 2022-08-03 MED ORDER — SURGIFLO WITH THROMBIN (HEMOSTATIC MATRIX KIT) OPTIME
TOPICAL | Status: DC | PRN
  Administered 2022-08-03: 1 via TOPICAL

## 2022-08-03 MED ORDER — LIDOCAINE HCL (CARDIAC) PF 100 MG/5ML IV SOSY
PREFILLED_SYRINGE | INTRAVENOUS | Status: DC | PRN
  Administered 2022-08-03: 100 mg via INTRAVENOUS

## 2022-08-03 MED ORDER — INSULIN ASPART 100 UNIT/ML IJ SOLN
0.0000 [IU] | Freq: Three times a day (TID) | INTRAMUSCULAR | Status: DC
  Administered 2022-08-03: 5 [IU] via SUBCUTANEOUS
  Administered 2022-08-04: 3 [IU] via SUBCUTANEOUS
  Filled 2022-08-03 (×2): qty 1

## 2022-08-03 SURGICAL SUPPLY — 46 items
BASIN KIT SINGLE STR (MISCELLANEOUS) ×1 IMPLANT
BUR NEURO DRILL SOFT 3.0X3.8M (BURR) ×1 IMPLANT
CHLORAPREP W/TINT 26 (MISCELLANEOUS) ×1 IMPLANT
CNTNR SPEC 2.5X3XGRAD LEK (MISCELLANEOUS) ×1
CONT SPEC 4OZ STER OR WHT (MISCELLANEOUS) ×1
CONTAINER SPEC 2.5X3XGRAD LEK (MISCELLANEOUS) ×1 IMPLANT
DERMABOND ADVANCED .7 DNX12 (GAUZE/BANDAGES/DRESSINGS) ×1 IMPLANT
DRAPE C ARM PK CFD 31 SPINE (DRAPES) ×1 IMPLANT
DRAPE LAPAROTOMY 100X77 ABD (DRAPES) ×1 IMPLANT
DRAPE MICROSCOPE SPINE 48X150 (DRAPES) ×1 IMPLANT
DRAPE SURG 17X11 SM STRL (DRAPES) ×1 IMPLANT
DRSG OPSITE POSTOP 4X6 (GAUZE/BANDAGES/DRESSINGS) IMPLANT
ELECT EZSTD 165MM 6.5IN (MISCELLANEOUS) ×1
ELECTRODE EZSTD 165MM 6.5IN (MISCELLANEOUS) ×1 IMPLANT
GLOVE BIOGEL PI IND STRL 6.5 (GLOVE) ×1 IMPLANT
GLOVE BIOGEL PI IND STRL 8.5 (GLOVE) ×1 IMPLANT
GLOVE SURG SYN 6.5 ES PF (GLOVE) ×1 IMPLANT
GLOVE SURG SYN 6.5 PF PI (GLOVE) ×1 IMPLANT
GLOVE SURG SYN 8.5  E (GLOVE) ×3
GLOVE SURG SYN 8.5 E (GLOVE) ×3 IMPLANT
GLOVE SURG SYN 8.5 PF PI (GLOVE) ×3 IMPLANT
GOWN SRG LRG LVL 4 IMPRV REINF (GOWNS) ×1 IMPLANT
GOWN SRG XL LVL 3 NONREINFORCE (GOWNS) ×1 IMPLANT
GOWN STRL NON-REIN TWL XL LVL3 (GOWNS) ×1
GOWN STRL REIN LRG LVL4 (GOWNS) ×1
GOWN STRL REUS W/ TWL XL LVL3 (GOWN DISPOSABLE) ×1 IMPLANT
GOWN STRL REUS W/TWL XL LVL3 (GOWN DISPOSABLE) ×1
KIT SPINAL PRONEVIEW (KITS) ×1 IMPLANT
MANIFOLD NEPTUNE II (INSTRUMENTS) ×1 IMPLANT
MARKER SKIN DUAL TIP RULER LAB (MISCELLANEOUS) ×1 IMPLANT
NDL SAFETY ECLIP 18X1.5 (MISCELLANEOUS) IMPLANT
NS IRRIG 1000ML POUR BTL (IV SOLUTION) ×1 IMPLANT
PACK LAMINECTOMY NEURO (CUSTOM PROCEDURE TRAY) ×1 IMPLANT
PAD ARMBOARD 7.5X6 YLW CONV (MISCELLANEOUS) ×1 IMPLANT
PENCIL SMOKE EVACUATOR COATED (MISCELLANEOUS) IMPLANT
SURGIFLO W/THROMBIN 8M KIT (HEMOSTASIS) ×1 IMPLANT
SUT DVC V-LOC 4-0 90 CLR P-12 (SUTURE) ×1
SUT VIC AB 0 CT1 27 (SUTURE) ×1
SUT VIC AB 0 CT1 27XCR 8 STRN (SUTURE) ×1 IMPLANT
SUT VIC AB 2-0 CT1 18 (SUTURE) ×1 IMPLANT
SUTURE DVC V-LC4-0 90 CLR P-12 (SUTURE) IMPLANT
SYR 10ML LL (SYRINGE) ×1 IMPLANT
SYR 30ML LL (SYRINGE) ×2 IMPLANT
SYR 3ML LL SCALE MARK (SYRINGE) ×1 IMPLANT
TRAP FLUID SMOKE EVACUATOR (MISCELLANEOUS) ×1 IMPLANT
WATER STERILE IRR 1000ML POUR (IV SOLUTION) ×2 IMPLANT

## 2022-08-03 NOTE — Interval H&P Note (Signed)
History and Physical Interval Note:  08/03/2022 9:48 AM  Cory Perez  has presented today for surgery, with the diagnosis of Lumbar radiculopathy M54.16 Neurogenic claudication due to lumbar spinal stenosis M48.062.  The various methods of treatment have been discussed with the patient and family. After consideration of risks, benefits and other options for treatment, the patient has consented to  Procedure(s): LEFT L3-S1 DECOMPRESSION (Left) as a surgical intervention.  The patient's history has been reviewed, patient examined, no change in status, stable for surgery.  I have reviewed the patient's chart and labs.  Questions were answered to the patient's satisfaction.    Heart sounds normal no MRG. Chest Clear to Auscultation Bilaterally.    [name]

## 2022-08-03 NOTE — Anesthesia Preprocedure Evaluation (Addendum)
Anesthesia Evaluation  Patient identified by MRN, date of birth, ID band Patient awake    Reviewed: Allergy & Precautions, NPO status , Patient's Chart, lab work & pertinent test results, reviewed documented beta blocker date and time   History of Anesthesia Complications Negative for: history of anesthetic complications  Airway Mallampati: III       Dental no notable dental hx.    Pulmonary sleep apnea and Continuous Positive Airway Pressure Ventilation , neg COPD, Not current smoker,    Pulmonary exam normal        Cardiovascular Exercise Tolerance: Good hypertension, Pt. on medications and Pt. on home beta blockers (-) angina+CHF (G2DD)  (-) Past MI + dysrhythmias (Aflutter, s/p ablation, no problems since) Atrial Fibrillation  Rhythm:Irregular Rate:Normal  Aldosterone excess (Conn syndrome)   - Most recent TTE was performed on 12/02/2021 revealing a normal left ventricular systolic function with an EF of >55%.  There was moderate left atrial enlargement.  Trivial to mild mitral, tricuspid, and pulmonary valve regurgitation noted.  There was no evidence of a significant transvalvular gradient to suggest stenosis.  - Myocardial perfusion imaging study performed on 12/03/2021 revealed a normal left ventricular systolic function with EF 59%.  There was normal myocardial thickening and wall motion.  No artifact is noted.  Left ventricular cavity size normal.  There was no evidence of stress-induced myocardial ischemia or arrhythmia; no scintigraphic evidence of scar. Study determined to be normal and low risk.   Neuro/Psych neg Seizures Anxiety Depression Lumbar Radiculopathy  Neuromuscular disease    GI/Hepatic Neg liver ROS, neg GERD  ,  Endo/Other  diabetes, Well Controlled, Type 2, Oral Hypoglycemic AgentsMorbid obesity  Renal/GU negative Renal ROS     Musculoskeletal  (+) Arthritis ,   Abdominal (+) + obese,   Peds   Hematology thrombocytopenia   Anesthesia Other Findings   Reproductive/Obstetrics                            Anesthesia Physical  Anesthesia Plan  ASA: 3  Anesthesia Plan: General   Post-op Pain Management: Toradol IV (intra-op)*, Ofirmev IV (intra-op)* and Regional block*   Induction: Intravenous  PONV Risk Score and Plan: 2 and Ondansetron and Dexamethasone  Airway Management Planned: Oral ETT  Additional Equipment:   Intra-op Plan:   Post-operative Plan: Extubation in OR  Informed Consent: I have reviewed the patients History and Physical, chart, labs and discussed the procedure including the risks, benefits and alternatives for the proposed anesthesia with the patient or authorized representative who has indicated his/her understanding and acceptance.     Dental advisory given  Plan Discussed with: CRNA  Anesthesia Plan Comments:       Anesthesia Quick Evaluation

## 2022-08-03 NOTE — Anesthesia Procedure Notes (Signed)
Procedure Name: Intubation Date/Time: 08/03/2022 10:27 AM  Performed by: Lia Foyer, CRNAPre-anesthesia Checklist: Patient identified, Emergency Drugs available, Suction available and Patient being monitored Patient Re-evaluated:Patient Re-evaluated prior to induction Oxygen Delivery Method: Circle system utilized Preoxygenation: Pre-oxygenation with 100% oxygen Induction Type: IV induction Ventilation: Mask ventilation without difficulty Laryngoscope Size: McGraph and 4 Tube type: Oral Number of attempts: 1 Airway Equipment and Method: Stylet Placement Confirmation: ETT inserted through vocal cords under direct vision, positive ETCO2 and breath sounds checked- equal and bilateral Secured at: 22 cm Tube secured with: Tape Dental Injury: Teeth and Oropharynx as per pre-operative assessment  Comments: Lauren Cozart, SRNA placed ETT under supervision.

## 2022-08-03 NOTE — Anesthesia Postprocedure Evaluation (Signed)
Anesthesia Post Note  Patient: Cory Perez  Procedure(s) Performed: LEFT L3-S1 DECOMPRESSION (Left)  Patient location during evaluation: PACU Anesthesia Type: General Level of consciousness: awake and oriented Pain management: satisfactory to patient Vital Signs Assessment: post-procedure vital signs reviewed and stable Respiratory status: spontaneous breathing and nonlabored ventilation Cardiovascular status: stable Anesthetic complications: no   No notable events documented.   Last Vitals:  Vitals:   08/03/22 0838 08/03/22 1345  BP: 136/81 (!) 141/83  Pulse: 62 72  Resp: 17 17  Temp: 36.5 C (!) 36.2 C  SpO2: 96% 100%    Last Pain:  Vitals:   08/03/22 1345  TempSrc:   PainSc: 0-No pain                 VAN STAVEREN,Kili Gracy

## 2022-08-03 NOTE — Transfer of Care (Signed)
Immediate Anesthesia Transfer of Care Note  Patient: Cory Perez  Procedure(s) Performed: LEFT L3-S1 DECOMPRESSION (Left)  Patient Location: PACU  Anesthesia Type:General  Level of Consciousness: drowsy  Airway & Oxygen Therapy: Patient Spontanous Breathing and Patient connected to face mask oxygen  Post-op Assessment: Report given to RN and Post -op Vital signs reviewed and stable  Post vital signs: Reviewed and stable  Last Vitals:  Vitals Value Taken Time  BP 141/83 08/03/22 1345  Temp    Pulse 71 08/03/22 1349  Resp 17 08/03/22 1349  SpO2 100 % 08/03/22 1349  Vitals shown include unvalidated device data.  Last Pain:  Vitals:   08/03/22 0838  TempSrc: Temporal  PainSc: 7          Complications: No notable events documented.

## 2022-08-03 NOTE — Op Note (Signed)
Indications: [name] is a 56 yo male who presented with: Lumbar radiculopathy M54.16, Neurogenic claudication due to lumbar spinal stenosis M48.062  He failed conservative management prompting surgical intervention.  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   Preoperative Note:   Risks of surgery discussed include: infection, bleeding, stroke, coma, death, paralysis, CSF leak, nerve/spinal cord injury, numbness, tingling, weakness, complex regional pain syndrome, recurrent stenosis and/or disc herniation, vascular injury, development of instability, neck/back pain, need for further surgery, persistent symptoms, development of deformity, and the risks of anesthesia. The patient understood these risks and agreed to proceed.  Operative Note:   1. L3-S1 lumbar decompression including central laminectomy and left laminoforaminotomies  The patient was then brought from the preoperative center with intravenous access established.  The patient underwent general anesthesia and endotracheal tube intubation, and was then rotated on the [place] rail top where all pressure points were appropriately padded.  The skin was then thoroughly cleansed.  Perioperative antibiotic prophylaxis was administered.  Sterile prep and drapes were then applied and a timeout was then observed.  C-arm was brought into the field under sterile conditions and under lateral visualization the L5-S1 interspace was identified and marked.  The incision was marked and opened, including the prior incision.   The metrx tubes were sequentially advanced and confirmed in position at L5-S1. An 38m by 740mtube was locked in place to the bed side  attachment.  The microscope was then sterilely brought into the field and muscle creep was hemostased with a bipolar and resected with a pituitary rongeur.  A Bovie extender was then used to expose the spinous process and lamina.  Careful attention was placed to not violate the facet capsule. A 3 mm matchstick drill bit was then used to make a hemi-laminotomy trough until the ligamentum flavum was exposed.  This was extended to the base of the spinous process and to the contralateral side to remove all the central bone from each side.  Once this was complete and the underlying ligamentum flavum was visualized, it was dissected with a curette and resected with Kerrison rongeurs.  Extensive ligamentum hypertrophy was noted, requiring a substantial amount of time and care for removal.  A likely synovial cyst was encountered.  This was carefully dissected free of the nerve root, then resected in piecemeal fashion.The dura was identified and palpated. The kerrison rongeur was then used to remove the medial facet bilaterally until no compression was noted.  A balltip probe was used to confirm decompression of the ipsilateral S1 nerve root.   No CSF leak was noted.  A Depo-Medrol soaked Gelfoam pledget was placed in the defect.  The wound was copiously irrigated. The tube system was then removed under microscopic visualization and hemostasis was obtained with a bipolar.    After performing the decompression at L5-S1, the metrx tubes were sequentially advanced and confirmed in position at L4-5. An 181my 28m66mbe was locked in place to the bed side attachment.  Fluoroscopy was then removed from the field.  The microscope was then sterilely brought into the field and muscle creep was hemostased with a bipolar and resected with a pituitary rongeur.  A Bovie extender was then used to expose the spinous process and lamina.  Careful attention was placed to not violate the facet capsule. A 3 mm matchstick drill bit was  then used to make a hemi-laminotomy trough until the ligamentum flavum was exposed.  This was extended to the base of the spinous process and to the contralateral side to remove all the central bone from each side.  Once this was complete and the underlying ligamentum flavum was visualized, it was dissected with a curette and resected with Kerrison rongeurs.  Extensive ligamentum hypertrophy was noted, requiring a substantial amount of time and care for removal.  The dura was identified and palpated. The kerrison rongeur was then used to remove the medial facet bilaterally until no compression was noted.  A balltip probe was used to confirm decompression of the ipsilateral L5 nerve root.  No CSF leak was noted.  A Depo-Medrol soaked Gelfoam pledget was placed in the defect.  The wound was copiously irrigated. The tube system was then removed under microscopic visualization and hemostasis was obtained with a bipolar.    After performing the decompression at L4-5, the metrx tubes were sequentially advanced and confirmed in position at L3-4. An 79m by 718mtube was locked in place to the bed side attachment.  Fluoroscopy was then removed from the field.  The microscope was then sterilely brought into the field and muscle creep was hemostased with a bipolar and resected with a pituitary rongeur.  A Bovie extender was then used to expose the spinous process and lamina.  Careful attention was placed to not violate the facet capsule. A 3 mm matchstick drill bit was then used to make a hemi-laminotomy trough until the ligamentum flavum was exposed.  This was extended to the base of the spinous process and to the contralateral side to remove all the central bone from each side.  Once this was complete and the underlying ligamentum flavum was visualized, it was dissected with a curette and resected with Kerrison rongeurs.  Extensive ligamentum hypertrophy was noted, requiring a substantial amount of time and care for  removal.  The dura was identified and palpated. The kerrison rongeur was then used to remove the medial facet bilaterally until no compression was noted.  A balltip probe was used to confirm decompression of the ipsilateral L4 nerve root.  No CSF leak was noted.  A Depo-Medrol soaked Gelfoam pledget was placed in the defect.  The wound was copiously irrigated. The tube system was then removed under microscopic visualization and hemostasis was obtained with a bipolar.     The fascial layer was reapproximated with the use of a 0 Vicryl suture.  Subcutaneous tissue layer was reapproximated using 2-0 Vicryl suture.  3-0 monocryl was placed in subcuticular fashion. The skin was then cleansed and Dermabond was used to close the skin opening.  Patient was then rotated back to the preoperative bed awakened from anesthesia and taken to recovery all counts are correct in this case.  I performed the entire procedure with the assistance of StGeronimo BootA as an asPensions consultantAn assistant was required for this procedure due to the complexity.  The assistant provided assistance in tissue manipulation and suction, and was required for the successful and safe performance of the procedure. I performed the critical portions of the procedure.   Retta Pitcher K. YaIzora RibasD

## 2022-08-03 NOTE — Plan of Care (Signed)
  Problem: Education: Goal: Ability to verbalize activity precautions or restrictions will improve Outcome: Progressing Goal: Knowledge of the prescribed therapeutic regimen will improve Outcome: Progressing Goal: Understanding of discharge needs will improve Outcome: Progressing   Problem: Activity: Goal: Ability to avoid complications of mobility impairment will improve Outcome: Progressing Goal: Ability to tolerate increased activity will improve Outcome: Progressing Goal: Will remain free from falls Outcome: Progressing   Problem: Bowel/Gastric: Goal: Gastrointestinal status for postoperative course will improve Outcome: Progressing   Problem: Clinical Measurements: Goal: Ability to maintain clinical measurements within normal limits will improve Outcome: Progressing Goal: Postoperative complications will be avoided or minimized Outcome: Progressing Goal: Diagnostic test results will improve Outcome: Progressing   Problem: Pain Management: Goal: Pain level will decrease Outcome: Progressing   Problem: Skin Integrity: Goal: Will show signs of wound healing Outcome: Progressing   Problem: Health Behavior/Discharge Planning: Goal: Identification of resources available to assist in meeting health care needs will improve Outcome: Progressing   Problem: Bladder/Genitourinary: Goal: Urinary functional status for postoperative course will improve Outcome: Progressing   Problem: Education: Goal: Ability to describe self-care measures that may prevent or decrease complications (Diabetes Survival Skills Education) will improve Outcome: Progressing Goal: Individualized Educational Video(s) Outcome: Progressing   Problem: Coping: Goal: Ability to adjust to condition or change in health will improve Outcome: Progressing   Problem: Fluid Volume: Goal: Ability to maintain a balanced intake and output will improve Outcome: Progressing   Problem: Health Behavior/Discharge  Planning: Goal: Ability to identify and utilize available resources and services will improve Outcome: Progressing Goal: Ability to manage health-related needs will improve Outcome: Progressing   Problem: Metabolic: Goal: Ability to maintain appropriate glucose levels will improve Outcome: Progressing   Problem: Nutritional: Goal: Maintenance of adequate nutrition will improve Outcome: Progressing Goal: Progress toward achieving an optimal weight will improve Outcome: Progressing   Problem: Skin Integrity: Goal: Risk for impaired skin integrity will decrease Outcome: Progressing   Problem: Tissue Perfusion: Goal: Adequacy of tissue perfusion will improve Outcome: Progressing   Problem: Education: Goal: Knowledge of General Education information will improve Description: Including pain rating scale, medication(s)/side effects and non-pharmacologic comfort measures Outcome: Progressing   Problem: Health Behavior/Discharge Planning: Goal: Ability to manage health-related needs will improve Outcome: Progressing   Problem: Clinical Measurements: Goal: Ability to maintain clinical measurements within normal limits will improve Outcome: Progressing Goal: Will remain free from infection Outcome: Progressing Goal: Diagnostic test results will improve Outcome: Progressing Goal: Respiratory complications will improve Outcome: Progressing Goal: Cardiovascular complication will be avoided Outcome: Progressing   Problem: Activity: Goal: Risk for activity intolerance will decrease Outcome: Progressing   Problem: Nutrition: Goal: Adequate nutrition will be maintained Outcome: Progressing   Problem: Coping: Goal: Level of anxiety will decrease Outcome: Progressing   Problem: Elimination: Goal: Will not experience complications related to bowel motility Outcome: Progressing Goal: Will not experience complications related to urinary retention Outcome: Progressing    Problem: Pain Managment: Goal: General experience of comfort will improve Outcome: Progressing   Problem: Safety: Goal: Ability to remain free from injury will improve Outcome: Progressing   Problem: Skin Integrity: Goal: Risk for impaired skin integrity will decrease Outcome: Progressing   

## 2022-08-04 ENCOUNTER — Encounter: Payer: Self-pay | Admitting: Neurosurgery

## 2022-08-04 DIAGNOSIS — M48062 Spinal stenosis, lumbar region with neurogenic claudication: Secondary | ICD-10-CM | POA: Diagnosis not present

## 2022-08-04 LAB — GLUCOSE, CAPILLARY: Glucose-Capillary: 168 mg/dL — ABNORMAL HIGH (ref 70–99)

## 2022-08-04 MED ORDER — ACETAMINOPHEN 325 MG PO TABS: 650.0000 mg | ORAL_TABLET | ORAL | Status: DC | PRN

## 2022-08-04 MED ORDER — OXYCODONE HCL 5 MG PO TABS: 5.0000 mg | ORAL_TABLET | ORAL | 0 refills | Status: DC | PRN

## 2022-08-04 NOTE — Plan of Care (Signed)
  Problem: Education: Goal: Knowledge of the prescribed therapeutic regimen will improve Outcome: Progressing Goal: Understanding of discharge needs will improve Outcome: Progressing   Problem: Activity: Goal: Ability to avoid complications of mobility impairment will improve Outcome: Progressing Goal: Ability to tolerate increased activity will improve Outcome: Progressing Goal: Will remain free from falls Outcome: Progressing   Problem: Fluid Volume: Goal: Ability to maintain a balanced intake and output will improve Outcome: Progressing   Problem: Nutritional: Goal: Maintenance of adequate nutrition will improve Outcome: Progressing   Problem: Tissue Perfusion: Goal: Adequacy of tissue perfusion will improve Outcome: Progressing   Problem: Clinical Measurements: Goal: Respiratory complications will improve Outcome: Progressing Goal: Cardiovascular complication will be avoided Outcome: Progressing   Problem: Nutrition: Goal: Adequate nutrition will be maintained Outcome: Progressing   Problem: Coping: Goal: Level of anxiety will decrease Outcome: Progressing   Problem: Elimination: Goal: Will not experience complications related to urinary retention Outcome: Progressing

## 2022-08-04 NOTE — Progress Notes (Signed)
AVS, discharge summar reviewed w/ patient, Ivs and LDA removed from chart. Patient arranged transport home w/ spouse who will arrive shortly.

## 2022-08-04 NOTE — Progress Notes (Signed)
    Attending Progress Note  History: Cory Perez is here for lumbar radiculopathy and neurogenic claudication.  POD1: Doing well.    Physical Exam: Vitals:   08/04/22 0004 08/04/22 0405  BP: 127/74 119/69  Pulse: 68 64  Resp: 16 18  Temp: 97.7 F (36.5 C) 98.6 F (37 C)  SpO2: 96% 97%    AA Ox3 CNI  Strength:5/5 throughout BLE  Data:  No results for input(s): "NA", "K", "CL", "CO2", "BUN", "CREATININE", "LABGLOM", "GLUCOSE", "CALCIUM" in the last 168 hours. No results for input(s): "AST", "ALT", "ALKPHOS" in the last 168 hours.  Invalid input(s): "TBILI"   No results for input(s): "WBC", "HGB", "HCT", "PLT" in the last 168 hours. No results for input(s): "APTT", "INR" in the last 168 hours.       Other tests/results: n/a  Assessment/Plan:  Cory Perez is here for lumbar decompression. He is doing well.  - mobilize - pain control - DVT prophylaxis - PTOT   Meade Maw MD, [facility] Department of Neurosurgery

## 2022-08-04 NOTE — Evaluation (Signed)
Occupational Therapy Evaluation Patient Details Name: Cory Perez MRN: [id number] DOB: [birth year] Today's Date: 08/04/2022   History of Present Illness Pt is a 56 year old male s/p L3-S1 decompression 08/03/22; PMH significant for atrial fibrillation/flutter (s/p ablation), angina, diastolic dysfunction, HTN, HLD, T2DM, OSAH (requires nocturnal PAP therapy), Conn syndrome, lumbar spinal stenosis, DJD, anxiety, depression   Clinical Impression   Pt seen for OT evaluation this date, POD#1 from above surgery. Prior to hospital admission, pt was independent with mobility, ADL, and IADL. Pt requires supervision with all aspects of mobility and self care tasks. Pt educated in precautions, self care skills, AE, and home/routines modifications to maximize safety and functional independence while minimizing falls risk and maintaining precautions. Pt verbalized understanding of all education/training provided. Able to return demonstration safe techniques while maintaining precautions throughout. No additional skilled OT needs at this time. OT will follow acutely, no OT needs identified following discharge. Pt is left in bedside chair, all needs met. Nurse aware of pt status.      Recommendations for follow up therapy are one component of a multi-disciplinary discharge planning process, led by the attending physician.  Recommendations may be updated based on patient status, additional functional criteria and insurance authorization.   Follow Up Recommendations  No OT follow up    Assistance Recommended at Discharge Intermittent Supervision/Assistance  Patient can return home with the following A little help with bathing/dressing/bathroom;Assistance with cooking/housework    Functional Status Assessment  Patient has had a recent decline in their functional status and demonstrates the ability to make significant improvements in function in a reasonable and predictable amount of time.  Equipment  Recommendations  BSC/3in1;Other (comment) (2WW; pt declines 3 i n1 commode at this time)    Recommendations for Other Services       Precautions / Restrictions Precautions Precautions: Fall;Back Precaution Comments: no brace needed Restrictions Weight Bearing Restrictions: No      Mobility Bed Mobility               General bed mobility comments: NT in recliner pre/post session    Transfers Overall transfer level: Needs assistance Equipment used: Rolling walker (2 wheels) Transfers: Sit to/from Stand Sit to Stand: Supervision           General transfer comment: no vcs required for safety      Balance Overall balance assessment: Needs assistance Sitting-balance support: Feet supported Sitting balance-Leahy Scale: Good     Standing balance support: Bilateral upper extremity supported, During functional activity Standing balance-Leahy Scale: Good                             ADL either performed or assessed with clinical judgement   ADL Overall ADL's : Needs assistance/impaired Eating/Feeding: Set up;Sitting   Grooming: Oral care;Standing;Supervision/safety Grooming Details (indicate cue type and reason): sink level, intermittent vcs for RW use             Lower Body Dressing: Supervision/safety Lower Body Dressing Details (indicate cue type and reason): socks, vcs for technique with precautions Toilet Transfer: Supervision/safety;Rolling walker (2 wheels);Ambulation;Regular Glass blower/designer Details (indicate cue type and reason): vcs for safety simulating home set up [place] and Hygiene: Supervision/safety;Sit to/from stand       Functional mobility during ADLs: Supervision/safety;Rolling walker (2 wheels) (hosehold distances, intermittent vcs for RW technique)       Vision Patient Visual Report: No change from baseline  Perception     Praxis      Pertinent Vitals/Pain Pain Assessment Pain  Assessment: 0-10 Pain Score: 3  Pain Location: lower back Pain Descriptors / Indicators: Aching, Sore Pain Intervention(s): Limited activity within patient's tolerance, Monitored during session, Repositioned     Hand Dominance     Extremity/Trunk Assessment Upper Extremity Assessment Upper Extremity Assessment: Overall WFL for tasks assessed   Lower Extremity Assessment Lower Extremity Assessment: Overall WFL for tasks assessed   Cervical / Trunk Assessment Cervical / Trunk Assessment: Back Surgery   Communication Communication Communication: No difficulties   Cognition Arousal/Alertness: Awake/alert Behavior During Therapy: WFL for tasks assessed/performed Overall Cognitive Status: Within Functional Limits for tasks assessed                                       General Comments       Exercises     Shoulder Instructions      Home Living Family/patient expects to be discharged to:: Private residence Living Arrangements: Spouse/significant other (16, 30 year old children) Available Help at Discharge: Family Type of Home: House Home Access: Stairs to enter Technical brewer of Steps: 2         Bathroom Shower/Tub: Occupational psychologist: Standard     Home Equipment: None          Prior Functioning/Environment Prior Level of Function : Independent/Modified Independent               ADLs Comments: indep ADL/IADL, employed (supervisory role), driving        OT Problem List: Decreased activity tolerance;Decreased knowledge of use of DME or AE      OT Treatment/Interventions: Self-care/ADL training;Patient/family education;Therapeutic activities;DME and/or AE instruction    OT Goals(Current goals can be found in the care plan section) Acute Rehab OT Goals Patient Stated Goal: go home OT Goal Formulation: With patient Time For Goal Achievement: 08/18/22 Potential to Achieve Goals: Good ADL Goals Pt Will Perform  Grooming: with modified independence;standing Pt Will Perform Lower Body Dressing: with modified independence;sit to/from stand Pt Will Transfer to Toilet: with modified independence;ambulating Pt Will Perform Toileting - Clothing Manipulation and hygiene: with modified independence;sit to/from stand  OT Frequency: Min 2X/week    Co-evaluation              AM-PAC OT "6 Clicks" Daily Activity     Outcome Measure Help from another person eating meals?: None Help from another person taking care of personal grooming?: None Help from another person toileting, which includes using toliet, bedpan, or urinal?: None Help from another person bathing (including washing, rinsing, drying)?: A Little Help from another person to put on and taking off regular upper body clothing?: None Help from another person to put on and taking off regular lower body clothing?: None 6 Click Score: 23   End of Session Equipment Utilized During Treatment: Gait belt;Rolling walker (2 wheels) Nurse Communication: Mobility status  Activity Tolerance: Patient tolerated treatment well Patient left: in chair;with call bell/phone within reach  OT Visit Diagnosis: Unsteadiness on feet (R26.81)                Time: 8110-3159 OT Time Calculation (min): 12 min Charges:  OT General Charges $OT Visit: 1 Visit OT Evaluation $OT Eval Low Complexity: 1 Low  Shanon Payor, OTD OTR/L  08/04/22, 9:27 AM

## 2022-08-04 NOTE — Discharge Summary (Signed)
Physician Discharge Summary  Patient ID: TUKKER BYRNS MRN: [id number] DOB/AGE: Nov 28, 1965 56 y.o.  Admit date: 08/03/2022 Discharge date: 08/04/2022  Admission Diagnoses: lumbar radiculopathy, neurogenic claudication due to lumbar stenosis  Discharge Diagnoses:  Principal Problem:   Neurogenic claudication Active Problems:   Lumbar radiculopathy   Neurogenic claudication due to lumbar spinal stenosis   Discharged Condition: good  Hospital Course: Mr. Creason is a 56 yo male who presented with lumbar radiculopathy and neurogenic claudication.  He had surgery and was felt to be stable for discharge on POD1.  Consults: None  Significant Diagnostic Studies: radiology: X-Ray: localization xrays in OR  Treatments: surgery: L3-S1 decompression  Discharge Exam: Blood pressure 119/69, pulse 64, temperature 98.6 F (37 C), resp. rate 18, height '6\' 6"'$  (1.981 m), weight 131 kg, SpO2 97 %. General appearance: alert and cooperative Doing well 5/5 throughout  Disposition: Discharge disposition: 01-Home or Self Care       Discharge Instructions     Incentive spirometry RT   Complete by: As directed       Allergies as of 08/04/2022       Reactions   Ketoconazole Hives        Medication List     TAKE these medications    acetaminophen 500 MG tablet Commonly known as: TYLENOL Take 1,000 mg by mouth every 6 (six) hours as needed (headache/pain.). What changed: Another medication with the same name was added. Make sure you understand how and when to take each.   acetaminophen 325 MG tablet Commonly known as: TYLENOL Take 2 tablets (650 mg total) by mouth every 4 (four) hours as needed for mild pain ((score 1 to 3) or temp > 100.5). What changed: You were already taking a medication with the same name, and this prescription was added. Make sure you understand how and when to take each.   baclofen 10 MG tablet Commonly known as: LIORESAL Take 10 mg by mouth 3 (three)  times daily.   cholecalciferol 25 MCG (1000 UNIT) tablet Commonly known as: VITAMIN D3 Take 1,000 Units by mouth daily.   cyanocobalamin 1000 MCG tablet Commonly known as: VITAMIN B12 Take 1,000 mcg by mouth daily.   fexofenadine 180 MG tablet Commonly known as: ALLEGRA Take 180 mg by mouth daily.   fluticasone 50 MCG/ACT nasal spray Commonly known as: FLONASE Place 2 sprays into both nostrils at bedtime.   gabapentin 300 MG capsule Commonly known as: NEURONTIN Take 300 mg by mouth 3 (three) times daily.   glimepiride 2 MG tablet Commonly known as: AMARYL Take 2 mg by mouth daily with breakfast.   Insulin Pen Needle 31G X 5 MM Misc by Does not apply route. Could not find exact match. See [facility] chart for where it is listed.   lisinopril 5 MG tablet Commonly known as: ZESTRIL Take 5 mg by mouth daily.   meloxicam 15 MG tablet Commonly known as: MOBIC Take 15 mg by mouth daily.   metoprolol succinate 50 MG 24 hr tablet Commonly known as: TOPROL-XL Take 50 mg by mouth daily. Take with or immediately following a meal.   oxyCODONE 5 MG immediate release tablet Commonly known as: Oxy IR/ROXICODONE Take 1 tablet (5 mg total) by mouth every 3 (three) hours as needed for moderate pain ((score 4 to 6)).   Ozempic (0.25 or 0.5 MG/DOSE) 2 MG/3ML Sopn Generic drug: Semaglutide(0.25 or 0.'5MG'$ /DOS) Inject into the skin.   pioglitazone 30 MG tablet Commonly known as: ACTOS Take 30 mg  by mouth daily.   rosuvastatin 10 MG tablet Commonly known as: CRESTOR Take 10 mg by mouth daily.   sertraline 100 MG tablet Commonly known as: ZOLOFT Take 200 mg by mouth at bedtime.   Soolantra 1 % Crea Generic drug: Ivermectin Apply 1 application topically daily as needed (rosacea).   Synjardy 12.03-999 MG Tabs Generic drug: Empagliflozin-metFORMIN HCl Take 2 tablets by mouth daily.        Follow-up Information     Geronimo Boot, PA-C Follow up in 2 week(s).   Specialty:  Neurosurgery Why: as scheduled Contact information: [address] [place] [address] [phone]                 Signed: Meade Maw 08/04/2022, 8:13 AM

## 2022-08-04 NOTE — Discharge Instructions (Signed)
Your surgeon has performed an operation on your lumbar spine (low back) to relieve pressure on one or more nerves. Many times, patients feel better immediately after surgery and can "overdo it." Even if you feel well, it is important that you follow these activity guidelines. If you do not let your back heal properly from the surgery, you can increase the chance of a disc herniation and/or return of your symptoms. The following are instructions to help in your recovery once you have been discharged from the hospital.  * It is ok to take NSAIDs after surgery.  Activity    No bending, lifting, or twisting ("BLT"). Avoid lifting objects heavier than 10 pounds (gallon milk jug).  Where possible, avoid household activities that involve lifting, bending, pushing, or pulling such as laundry, vacuuming, grocery shopping, and childcare. Try to arrange for help from friends and family for these activities while your back heals.  Increase physical activity slowly as tolerated.  Taking short walks is encouraged, but avoid strenuous exercise. Do not jog, run, bicycle, lift weights, or participate in any other exercises unless specifically allowed by your doctor. Avoid prolonged sitting, including car rides.  Talk to your doctor before resuming sexual activity.  You should not drive until cleared by your doctor.  Until released by your doctor, you should not return to work or school.  You should rest at home and let your body heal.   You may shower two days after your surgery.  After showering, lightly dab your incision dry. Do not take a tub bath or go swimming for 3 weeks, or until approved by your doctor at your follow-up appointment.  If you smoke, we strongly recommend that you quit.  Smoking has been proven to interfere with normal healing in your back and will dramatically reduce the success rate of your surgery. Please contact [facility] (800-QUIT-NOW) and use the resources at www.[facility].com for  assistance in stopping smoking.  Surgical Incision   If you have a dressing on your incision, you may remove it three days after your surgery. Keep your incision area clean and dry.  If you have staples or stitches on your incision, you should have a follow up scheduled for removal. If you do not have staples or stitches, you will have steri-strips (small pieces of surgical tape) or Dermabond glue. The steri-strips/glue should begin to peel away within about a week (it is fine if the steri-strips fall off before then). If the strips are still in place one week after your surgery, you may gently remove them.  Diet            You may return to your usual diet. Be sure to stay hydrated.  When to Contact Us  Although your surgery and recovery will likely be uneventful, you may have some residual numbness, aches, and pains in your back and/or legs. This is normal and should improve in the next few weeks.  However, should you experience any of the following, contact us immediately: New numbness or weakness Pain that is progressively getting worse, and is not relieved by your pain medications or rest Bleeding, redness, swelling, pain, or drainage from surgical incision Chills or flu-like symptoms Fever greater than 101.0 F (38.3 C) Problems with bowel or bladder functions Difficulty breathing or shortness of breath Warmth, tenderness, or swelling in your calf  Contact Information During office hours (Monday-Friday 9 am to 5 pm), please call your physician at [phone] and ask for Kendelyn Jean After hours and   weekends, please call [phone] and speak with the neurosurgeon on call For a life-threatening emergency, call 911 

## 2022-08-04 NOTE — TOC Progression Note (Signed)
Transition of Care [facility]) - Progression Note    Patient Details  Name: Cory Perez MRN: [id number] Date of Birth: [birth year]  [facility]) CM/SW [place], RN Phone Number: 08/04/2022, 9:16 AM  Clinical Narrative:     Spoke with the patient and he will need a rolling walker, [employer] will deliver to the room, He will do Outpatient PT if he needs it, He did PT prior to Surgery.  He has a follow up appointment with his Dr, Wife to provide transport       Expected Discharge Plan and Services           Expected Discharge Date: 08/04/22                                     Social Determinants of Health (SDOH) Interventions    Readmission Risk Interventions     No data to display

## 2022-08-04 NOTE — Evaluation (Signed)
Physical Therapy Evaluation Patient Details Name: KERRIGAN GLENDENING MRN: [id number] DOB: 08-Nov-1966 Today's Date: 08/04/2022  History of Present Illness  Pt is a 56 year old male s/p L3-S1 decompression 08/03/22; PMH significant for atrial fibrillation/flutter (s/p ablation), angina, diastolic dysfunction, HTN, HLD, T2DM, OSAH (requires nocturnal PAP therapy), Conn syndrome, lumbar spinal stenosis, DJD, anxiety, depression  Clinical Impression  Pt did well with PT exam and showed good safety awareness and confidence with bed mobility, ambulation (w/ and w/o AD) and was able to negotiate up/down steps w/o issue.  He will have assist at home and reports that L LE especially is much better than pre surgery but that he continues to have b/l buttock pain.  Pt able to ambulate w/o AD but safer and more confident with walker so recommending that for home.       Recommendations for follow up therapy are one component of a multi-disciplinary discharge planning process, led by the attending physician.  Recommendations may be updated based on patient status, additional functional criteria and insurance authorization.  Follow Up Recommendations Follow physician's recommendations for discharge plan and follow up therapies      Assistance Recommended at Discharge Set up Supervision/Assistance  Patient can return home with the following  A little help with bathing/dressing/bathroom;Assistance with cooking/housework;Assist for transportation    Equipment Recommendations Rolling walker (2 wheels)  Recommendations for Other Services       Functional Status Assessment Patient has had a recent decline in their functional status and demonstrates the ability to make significant improvements in function in a reasonable and predictable amount of time.     Precautions / Restrictions Precautions Precautions: Fall;Back Precaution Comments: no brace needed Restrictions Weight Bearing Restrictions: No       Mobility  Bed Mobility Overal bed mobility: Modified Independent             General bed mobility comments: able to get himself to sitting EOB w/o issue, able to keep spine neutral    Transfers Overall transfer level: Needs assistance Equipment used: Rolling walker (2 wheels) Transfers: Sit to/from Stand Sit to Stand: Supervision           General transfer comment: Pt did have to give some extra effort from standard height bed due to his height, but rose w/o assist    Ambulation/Gait Ambulation/Gait assistance: Supervision Gait Distance (Feet): 250 Feet Assistive device: Rolling walker (2 wheels), 1 person hand held assist, None         General Gait Details: Pt was able to ambulate variously with walker, single UE support and even w/o AD.  He did not have any LOBs but was clearly more confident/comfortable with walker w/o excessive reliance on it.  Stairs Stairs: Yes Stairs assistance: Supervision Stair Management: Two rails, Alternating pattern Number of Stairs: 4 General stair comments: Pt able to reciprocally negotiate up/down steps safely and w/o issue  Wheelchair Mobility    Modified Rankin (Stroke Patients Only)       Balance Overall balance assessment: Modified Independent   Sitting balance-Leahy Scale: Good     Standing balance support: Bilateral upper extremity supported, During functional activity Standing balance-Leahy Scale: Good                               Pertinent Vitals/Pain Pain Assessment Pain Assessment: 0-10 Pain Score: 3  Pain Location: low back and b/l (L>R) buttocks Pain Descriptors / Indicators: Aching, Sore  Home Living Family/patient expects to be discharged to:: Private residence Living Arrangements: Spouse/significant other Available Help at Discharge: Family Type of Home: House Home Access: Stairs to enter Entrance Stairs-Rails: Can reach both Entrance Stairs-Number of Steps: 2   Home Layout:  One level Home Equipment: [place] - single point      Prior Function Prior Level of Function : Independent/Modified Independent             Mobility Comments: Pt reports that he has been limping around more recently and needing to take regular breaks ADLs Comments: indep ADL/IADL, employed (supervisory role), driving     Hand Dominance        Extremity/Trunk Assessment   Upper Extremity Assessment Upper Extremity Assessment: Overall WFL for tasks assessed    Lower Extremity Assessment Lower Extremity Assessment: Overall WFL for tasks assessed (functional b/l, minimally weaker in proximal L LE)    Cervical / Trunk Assessment Cervical / Trunk Assessment: Back Surgery  Communication   Communication: No difficulties  Cognition Arousal/Alertness: Awake/alert Behavior During Therapy: WFL for tasks assessed/performed Overall Cognitive Status: Within Functional Limits for tasks assessed                                          General Comments      Exercises     Assessment/Plan    PT Assessment Patient needs continued PT services  PT Problem List Decreased strength;Decreased activity tolerance;Decreased balance;Decreased mobility;Decreased knowledge of use of DME;Decreased safety awareness;Pain       PT Treatment Interventions DME instruction;Gait training;Stair training;Functional mobility training;Therapeutic activities;Therapeutic exercise;Balance training;Neuromuscular re-education;Patient/family education    PT Goals (Current goals can be found in the Care Plan section)  Acute Rehab PT Goals Patient Stated Goal: go home PT Goal Formulation: With patient Time For Goal Achievement: 08/17/22 Potential to Achieve Goals: Good    Frequency 7X/week     Co-evaluation               AM-PAC PT "6 Clicks" Mobility  Outcome Measure Help needed turning from your back to your side while in a flat bed without using bedrails?: None Help needed  moving from lying on your back to sitting on the side of a flat bed without using bedrails?: None Help needed moving to and from a bed to a chair (including a wheelchair)?: None Help needed standing up from a chair using your arms (e.g., wheelchair or bedside chair)?: None Help needed to walk in hospital room?: None Help needed climbing 3-5 steps with a railing? : None 6 Click Score: 24    End of Session Equipment Utilized During Treatment: Gait belt Activity Tolerance: Patient tolerated treatment well Patient left: in chair;with call bell/phone within reach Nurse Communication: Mobility status PT Visit Diagnosis: Muscle weakness (generalized) (M62.81);Difficulty in walking, not elsewhere classified (R26.2);Other symptoms and signs involving the nervous system (N82.956)    Time: 2130-8657 PT Time Calculation (min) (ACUTE ONLY): 13 min   Charges:   PT Evaluation $PT Eval Low Complexity: 1 Low          Kreg Shropshire, DPT 08/04/2022, 10:27 AM

## 2022-08-17 NOTE — Progress Notes (Unsigned)
   REFERRING PHYSICIAN:  Baxter Hire, [address] [place],  [place] [address]  DOS: L3-S1 lumbar decompression including central laminectomy and left laminoforaminotomies on 08/03/22  HISTORY OF PRESENT ILLNESS: YAKIR WENKE is approximately 2 weeks status post L3-S1 lumbar decompression including central laminectomy and left laminoforaminotomies. Was given oxyIR on discharge from the hospital.   He is taking baclofen, mobic, and neurontin. Not taking oxyIR.   He has minimal LBP near his incision. Also with some left ankle pain that is chronic.   PHYSICAL EXAMINATION:  General: Patient is well developed, well nourished, calm, collected, and in no apparent distress.   NEUROLOGICAL:  General: In no acute distress.   Awake, alert, oriented to person, place, and time.  Pupils equal round and reactive to light.  Facial tone is symmetric.  Tongue protrusion is midline.     Strength:          Side Iliopsoas Quads Hamstring PF DF EHL  R '5 5 5 5 5 5  '$ L '5 5 5 5 5 5   '$ Incision c/d/i   ROS (Neurologic):  Negative except as noted above  IMAGING: Nothing new to review.   ASSESSMENT/PLAN:  ANTOINE VANDERMEULEN is doing very well s/p above surgery. Treatment options reviewed with patient and following plan made:   - I have advised the patient to lift up to 10 pounds until 6 weeks after surgery (follow up with Dr. Izora Ribas).  - Reviewed wound care.  - No bending, twisting, or lifting.  - Continue on current medications including mobic, baclofen, and neurontin.  - He will follow back up with specialist regarding left ankle pain.  - Follow up as scheduled in 4 weeks and prn.   Advised to contact the office if any questions or concerns arise.  Geronimo Boot PA-C Department of neurosurgery

## 2022-08-18 ENCOUNTER — Encounter: Payer: Self-pay | Admitting: Orthopedic Surgery

## 2022-08-18 ENCOUNTER — Ambulatory Visit (INDEPENDENT_AMBULATORY_CARE_PROVIDER_SITE_OTHER): Payer: PRIVATE HEALTH INSURANCE | Admitting: Orthopedic Surgery

## 2022-08-18 VITALS — BP 120/73 | HR 60 | Temp 97.6°F

## 2022-08-18 DIAGNOSIS — Z09 Encounter for follow-up examination after completed treatment for conditions other than malignant neoplasm: Secondary | ICD-10-CM

## 2022-08-18 DIAGNOSIS — Z9889 Other specified postprocedural states: Secondary | ICD-10-CM

## 2022-08-18 DIAGNOSIS — M5416 Radiculopathy, lumbar region: Secondary | ICD-10-CM

## 2022-08-18 DIAGNOSIS — M48062 Spinal stenosis, lumbar region with neurogenic claudication: Secondary | ICD-10-CM

## 2022-08-29 ENCOUNTER — Emergency Department
Admission: EM | Admit: 2022-08-29 | Discharge: 2022-08-29 | Disposition: A | Discharge status: Home / Self Care | Payer: PRIVATE HEALTH INSURANCE | Attending: Student in an Organized Health Care Education/Training Program | Admitting: Student in an Organized Health Care Education/Training Program

## 2022-08-29 ENCOUNTER — Other Ambulatory Visit: Payer: Self-pay

## 2022-08-29 ENCOUNTER — Emergency Department: Payer: PRIVATE HEALTH INSURANCE

## 2022-08-29 DIAGNOSIS — I4892 Unspecified atrial flutter: Secondary | ICD-10-CM | POA: Diagnosis not present

## 2022-08-29 DIAGNOSIS — R002 Palpitations: Secondary | ICD-10-CM | POA: Diagnosis present

## 2022-08-29 LAB — CBC
HCT: 44.5 % (ref 39.0–52.0)
Hemoglobin: 15.1 g/dL (ref 13.0–17.0)
MCH: 32.5 pg (ref 26.0–34.0)
MCHC: 33.9 g/dL (ref 30.0–36.0)
MCV: 95.9 fL (ref 80.0–100.0)
Platelets: 103 10*3/uL — ABNORMAL LOW (ref 150–400)
RBC: 4.64 MIL/uL (ref 4.22–5.81)
RDW: 13.2 % (ref 11.5–15.5)
WBC: 5.7 10*3/uL (ref 4.0–10.5)
nRBC: 0 % (ref 0.0–0.2)

## 2022-08-29 LAB — BASIC METABOLIC PANEL
Anion gap: 8 (ref 5–15)
BUN: 21 mg/dL — ABNORMAL HIGH (ref 6–20)
CO2: 25 mmol/L (ref 22–32)
Calcium: 9.1 mg/dL (ref 8.9–10.3)
Chloride: 102 mmol/L (ref 98–111)
Creatinine, Ser: 0.99 mg/dL (ref 0.61–1.24)
GFR, Estimated: >60 mL/min (ref >60)
Glucose, Bld: 208 mg/dL — ABNORMAL HIGH (ref 70–99)
Potassium: 4.5 mmol/L (ref 3.5–5.1)
Sodium: 135 mmol/L (ref 135–145)

## 2022-08-29 LAB — URINALYSIS, ROUTINE W REFLEX MICROSCOPIC
Bacteria, UA: NONE SEEN
Bilirubin Urine: NEGATIVE
Glucose, UA: >=500 mg/dL — AB
Hgb urine dipstick: NEGATIVE
Ketones, ur: NEGATIVE mg/dL
Leukocytes,Ua: NEGATIVE
Nitrite: NEGATIVE
Protein, ur: NEGATIVE mg/dL
Specific Gravity, Urine: 1.006 (ref 1.005–1.030)
Squamous Epithelial / HPF: NONE SEEN (ref 0–5)
WBC, UA: NONE SEEN WBC/hpf (ref 0–5)
pH: 5 (ref 5.0–8.0)

## 2022-08-29 LAB — TROPONIN I (HIGH SENSITIVITY)
Troponin I (High Sensitivity): 5 ng/L (ref <18)
Troponin I (High Sensitivity): 5 ng/L (ref <18)

## 2022-08-29 NOTE — ED Provider Notes (Signed)
[facility] Provider Note    Event Date/Time   First MD Initiated Contact with Patient 08/29/22 2023     (approximate)   History   Palpitations   HPI  Cory Perez is a 56 y.o. male presents the ER for evaluation of fainting episode that occurred on Thursday.  States he was using the bathroom felt like he had severe urgency is use the restroom then started feeling weak.  Was able to help himself to the ground.  Did not fully lose consciousness did not hit his head.  States he has been quite stressed out started feeling some fluttering sensation in his chest denies any chest pain or shortness of breath.  No fevers.  Does have a history of paroxysmal a flutter not on any anticoagulation.  States that in the past when he is really stressed out this will occur and then by the time he follows up with cardiology it is resolved.     Physical Exam   Triage Vital Signs: ED Triage Vitals  Enc Vitals Group     BP 08/29/22 1507 133/79     Pulse Rate 08/29/22 1507 78     Resp 08/29/22 1507 18     Temp 08/29/22 1507 97.9 F (36.6 C)     Temp Source 08/29/22 1507 Oral     SpO2 08/29/22 1507 95 %     Weight 08/29/22 1507 285 lb (129.3 kg)     Height 08/29/22 1507 '6\' 6"'$  (1.981 m)     Head Circumference --      Peak Flow --      Pain Score 08/29/22 1458 1     Pain Loc --      Pain Edu? --      Excl. in [place]? --     Most recent vital signs: Vitals:   08/29/22 1507 08/29/22 1847  BP: 133/79 128/74  Pulse: 78 77  Resp: 18 (!) 21  Temp: 97.9 F (36.6 C) 98.3 F (36.8 C)  SpO2: 95% 97%     Constitutional: Alert  Eyes: Conjunctivae are normal.  Head: Atraumatic. Nose: No congestion/rhinnorhea. Mouth/Throat: Mucous membranes are moist.   Neck: Painless ROM.  Cardiovascular:   Good peripheral circulation. Respiratory: Normal respiratory effort.  No retractions.  Gastrointestinal: Soft and nontender.  Musculoskeletal:  no deformity Neurologic:  MAE  spontaneously. No gross focal neurologic deficits are appreciated.  Skin:  Skin is warm, dry and intact. No rash noted. Psychiatric: Mood and affect are normal. Speech and behavior are normal.    ED Results / Procedures / Treatments   Labs (all labs ordered are listed, but only abnormal results are displayed) Labs Reviewed  BASIC METABOLIC PANEL - Abnormal; Notable for the following components:      Result Value   Glucose, Bld 208 (*)    BUN 21 (*)    All other components within normal limits  CBC - Abnormal; Notable for the following components:   Platelets 103 (*)    All other components within normal limits  URINALYSIS, ROUTINE W REFLEX MICROSCOPIC  TROPONIN I (HIGH SENSITIVITY)  TROPONIN I (HIGH SENSITIVITY)     EKG  ED ECG REPORT I, Merlyn Lot, the attending physician, personally viewed and interpreted this ECG.   Date: 08/29/2022  EKG Time: 21:10  Rate: 80  Rhythm: a flutter  Axis: normal  Intervals: normal  ST&T Change: nonspecific st abn, no stemi    RADIOLOGY Please see ED Course for my  review and interpretation.  I personally reviewed all radiographic images ordered to evaluate for the above acute complaints and reviewed radiology reports and findings.  These findings were personally discussed with the patient.  Please see medical record for radiology report.    PROCEDURES:  Critical Care performed:   Procedures   MEDICATIONS ORDERED IN ED: Medications - No data to display   IMPRESSION / MDM / [place] / ED COURSE  I reviewed the triage vital signs and the nursing notes.                              Differential diagnosis includes, but is not limited to, dysrhythmia, electrolyte abnormality, anemia, ACS, vasovagal  Patient presenting to the ER for evaluation of symptoms as described above.  Base on symptoms, risk factors and considered above differential, this presenting complaint could reflect a potentially life-threatening  illness therefore the patient will be placed on continuous pulse oximetry and telemetry for monitoring.  Laboratory evaluation will be sent to evaluate for the above complaints.  Patient well-appearing no acute distress.  No anemia no fevers no sign of infectious process electrolytes are normal.  Chest x-ray on my review and interpretation does not show any evidence of consolidation or cardiomegaly no pneumothorax.  Not consistent with PE.  Did not hit his head no dirt noted neurodeficits.  He is in rate controlled a flutter.  Cardiac enzymes are normal.  Discussed these findings patient agrees to follow-up with cardiology.  I do not feel that he requires hospitalization at this time.     FINAL CLINICAL IMPRESSION(S) / ED DIAGNOSES   Final diagnoses:  Atrial flutter, unspecified type ([place])     Rx / DC Orders   ED Discharge Orders          Ordered    Ambulatory referral to Cardiology       Comments: If you have not heard from the Cardiology office within the next 72 hours please call [phone].   08/29/22 2122             Note:  This document was prepared using Dragon voice recognition software and may include unintentional dictation errors.    Merlyn Lot, MD 08/29/22 2126

## 2022-08-29 NOTE — ED Provider Triage Note (Signed)
  Emergency Medicine Provider Triage Evaluation Note  GAGANDEEP PETTET , a 56 y.o.male,  was evaluated in triage.  Pt complains of palpitations.  Patient states he has a history of A-fib which goes on and off frequently.  Dates that he did have a syncopal episode the other day.  Reports some chest heaviness and shortness of breath today, which is slowly resolved at the time goes on.    Review of Systems  Positive: Palpitations, chest heaviness, shortness of breath Negative: Denies fever, chest pain, vomiting  Physical Exam   Vitals:   08/29/22 1507  BP: 133/79  Pulse: 78  Resp: 18  Temp: 97.9 F (36.6 C)  SpO2: 95%   Gen:   Awake, no distress   Resp:  Normal effort  MSK:   Moves extremities without difficulty  Other:    Medical Decision Making  Given the patient's initial medical screening exam, the following diagnostic evaluation has been ordered. The patient will be placed in the appropriate treatment space, once one is available, to complete the evaluation and treatment. I have discussed the plan of care with the patient and I have advised the patient that an ED physician or mid-level practitioner will reevaluate their condition after the test results have been received, as the results may give them additional insight into the type of treatment they may need.    Diagnostics: Labs, EKG, CXR  Treatments: none immediately   Teodoro Spray, [place] 08/29/22 1642

## 2022-08-29 NOTE — ED Triage Notes (Addendum)
Pt comes with c/o palpitations. Pt states hx of Afib. Pt states he had syncopal episode. Pt states hx of ablation. Pt states some chest heaviness and sob. Pt states it just feels weird.    Pt states episode was Thursday night.  Pt did have back surgery Sept 13

## 2022-08-29 NOTE — Telephone Encounter (Signed)
He is s/p L3-S1 lumbar decompression including central laminectomy and left laminoforaminotomies on 08/03/22.

## 2022-09-05 MED ORDER — CLINDAMYCIN HCL 300 MG PO CAPS: 300.0000 mg | ORAL_CAPSULE | Freq: Three times a day (TID) | ORAL | 0 refills | Status: AC

## 2022-09-05 NOTE — Addendum Note (Signed)
Addended by: Berdine Addison on: 09/05/2022 01:08 PM   Modules accepted: Orders

## 2022-09-15 ENCOUNTER — Encounter: Payer: Self-pay | Admitting: Neurosurgery

## 2022-09-15 ENCOUNTER — Ambulatory Visit (INDEPENDENT_AMBULATORY_CARE_PROVIDER_SITE_OTHER): Payer: PRIVATE HEALTH INSURANCE | Admitting: Neurosurgery

## 2022-09-15 VITALS — BP 128/68 | Ht 78.0 in | Wt 285.0 lb

## 2022-09-15 DIAGNOSIS — Z09 Encounter for follow-up examination after completed treatment for conditions other than malignant neoplasm: Secondary | ICD-10-CM

## 2022-09-15 DIAGNOSIS — M48062 Spinal stenosis, lumbar region with neurogenic claudication: Secondary | ICD-10-CM

## 2022-09-15 DIAGNOSIS — M5416 Radiculopathy, lumbar region: Secondary | ICD-10-CM

## 2022-09-15 NOTE — Progress Notes (Signed)
   REFERRING PHYSICIAN:  Baxter Hire, Md [place],  [place] [address]  DOS: L3-S1 lumbar decompression including central laminectomy and left laminoforaminotomies on 08/03/22  HISTORY OF PRESENT ILLNESS: Cory Perez is status post L3-S1 lumbar decompression including central laminectomy and left laminoforaminotomies.   He is doing very well.  He has minimal pain.  He does have some increasing arthritic related pain as he has had to stop his meloxicam while waiting to have his heart cardioverted.   PHYSICAL EXAMINATION:  General: Patient is well developed, well nourished, calm, collected, and in no apparent distress.   NEUROLOGICAL:  General: In no acute distress.   Awake, alert, oriented to person, place, and time.  Pupils equal round and reactive to light.  Facial tone is symmetric.  Tongue protrusion is midline.     Strength:          Side Iliopsoas Quads Hamstring PF DF EHL  R '5 5 5 5 5 5  '$ L '5 5 5 5 5 5   '$ Incision c/d/i   ROS (Neurologic):  Negative except as noted above  IMAGING: Nothing new to review.   ASSESSMENT/PLAN:  Cory Perez is doing very well s/p above surgery.  I am very pleased with his response to surgery.  I will see him back in 6 weeks.  He will go back to work next week.  We reviewed his activity limitations.  Meade Maw, MD  Department of neurosurgery

## 2022-09-27 MED ORDER — SODIUM CHLORIDE 0.9 % IV SOLN: INTRAVENOUS | Status: DC

## 2022-09-28 ENCOUNTER — Encounter: Payer: Self-pay | Admitting: Anesthesiology

## 2022-09-28 ENCOUNTER — Ambulatory Visit
Admission: RE | Admit: 2022-09-28 | Discharge: 2022-09-28 | Disposition: A | Discharge status: Home / Self Care | Payer: PRIVATE HEALTH INSURANCE | Attending: Internal Medicine | Admitting: Internal Medicine

## 2022-09-28 ENCOUNTER — Other Ambulatory Visit: Payer: Self-pay

## 2022-09-28 ENCOUNTER — Encounter
Admission: RE | Disposition: A | Discharge status: Home / Self Care | Payer: Self-pay | Source: Home / Self Care | Attending: Internal Medicine

## 2022-09-28 DIAGNOSIS — Z539 Procedure and treatment not carried out, unspecified reason: Secondary | ICD-10-CM | POA: Diagnosis not present

## 2022-09-28 DIAGNOSIS — I4891 Unspecified atrial fibrillation: Secondary | ICD-10-CM | POA: Insufficient documentation

## 2022-09-28 DIAGNOSIS — I4892 Unspecified atrial flutter: Secondary | ICD-10-CM

## 2022-09-28 HISTORY — PX: CARDIOVERSION: SHX1299

## 2022-09-28 LAB — GLUCOSE, CAPILLARY: Glucose-Capillary: 152 mg/dL — ABNORMAL HIGH (ref 70–99)

## 2022-09-28 SURGERY — CARDIOVERSION
Anesthesia: General

## 2022-09-28 MED ORDER — PROPOFOL 10 MG/ML IV BOLUS
INTRAVENOUS | Status: AC
  Filled 2022-09-28: qty 20

## 2022-09-28 NOTE — OR Nursing (Signed)
Pt arrived at designated time and was prepped for anesthesia assisted cardioversion. However the Anesthesiologist evaluated medications he found the patient took Ozempic was taken by patient yesterday. Pt reported Dr Gabriel Carina did not instruct him to hold that medication as pre op direction. Pt discharged without procedure. He will be rescheduled by the office. Dr Nehemiah Massed notified via [employer] system. Pt and wife aware of the cancellation

## 2022-09-29 ENCOUNTER — Encounter: Payer: Self-pay | Admitting: Internal Medicine

## 2022-10-10 MED ORDER — SODIUM CHLORIDE 0.9 % IV SOLN: INTRAVENOUS | Status: DC

## 2022-10-11 ENCOUNTER — Ambulatory Visit
Admission: RE | Admit: 2022-10-11 | Discharge: 2022-10-11 | Disposition: A | Discharge status: Home / Self Care | Payer: PRIVATE HEALTH INSURANCE | Attending: Internal Medicine | Admitting: Internal Medicine

## 2022-10-11 ENCOUNTER — Ambulatory Visit: Payer: PRIVATE HEALTH INSURANCE | Admitting: Anesthesiology

## 2022-10-11 ENCOUNTER — Encounter
Admission: RE | Disposition: A | Discharge status: Home / Self Care | Payer: Self-pay | Source: Home / Self Care | Attending: Internal Medicine

## 2022-10-11 DIAGNOSIS — I10 Essential (primary) hypertension: Secondary | ICD-10-CM | POA: Insufficient documentation

## 2022-10-11 DIAGNOSIS — G4730 Sleep apnea, unspecified: Secondary | ICD-10-CM | POA: Insufficient documentation

## 2022-10-11 DIAGNOSIS — I4892 Unspecified atrial flutter: Secondary | ICD-10-CM | POA: Diagnosis not present

## 2022-10-11 DIAGNOSIS — I4891 Unspecified atrial fibrillation: Secondary | ICD-10-CM | POA: Diagnosis present

## 2022-10-11 DIAGNOSIS — F418 Other specified anxiety disorders: Secondary | ICD-10-CM | POA: Diagnosis not present

## 2022-10-11 DIAGNOSIS — E119 Type 2 diabetes mellitus without complications: Secondary | ICD-10-CM | POA: Insufficient documentation

## 2022-10-11 DIAGNOSIS — E785 Hyperlipidemia, unspecified: Secondary | ICD-10-CM | POA: Diagnosis not present

## 2022-10-11 HISTORY — PX: CARDIOVERSION: SHX1299

## 2022-10-11 LAB — GLUCOSE, CAPILLARY: Glucose-Capillary: 150 mg/dL — ABNORMAL HIGH (ref 70–99)

## 2022-10-11 SURGERY — CARDIOVERSION
Anesthesia: General

## 2022-10-11 MED ORDER — PROPOFOL 10 MG/ML IV BOLUS
INTRAVENOUS | Status: DC | PRN
  Administered 2022-10-11: 60 mg via INTRAVENOUS
  Administered 2022-10-11: 20 mg via INTRAVENOUS
  Administered 2022-10-11: 30 mg via INTRAVENOUS

## 2022-10-11 NOTE — CV Procedure (Signed)
Electrical Cardioversion Procedure Note Cory Perez [id number] [birth year]  Procedure: Electrical Cardioversion Indications:  paroxysmal non valvular atrial flutter  Procedure Details Consent: Risks of procedure as well as the alternatives and risks of each were explained to the (patient/caregiver).  Consent for procedure obtained. Time Out: Verified patient identification, verified procedure, site/side was marked, verified correct patient position, special equipment/implants available, medications/allergies/relevent history reviewed, required imaging and test results available.  Performed  Patient placed on cardiac monitor, pulse oximetry, supplemental oxygen as necessary.  Sedation given: Propofol and versed as per anesthesia  Pacer pads placed anterior and posterior chest.  Cardioverted 1 time(s).  Cardioverted at 120J.  Evaluation Findings: Post procedure EKG shows: NSR Complications: None Patient did tolerate procedure well.   Serafina Royals M.D. [facility] 10/11/2022, 7:40 AM

## 2022-10-11 NOTE — Transfer of Care (Signed)
Immediate Anesthesia Transfer of Care Note  Patient: Cory Perez  Procedure(s) Performed: CARDIOVERSION  Patient Location: PACU  Anesthesia Type:General  Level of Consciousness: sedated and responds to stimulation  Airway & Oxygen Therapy: Patient Spontanous Breathing and Patient connected to nasal cannula oxygen  Post-op Assessment: Report given to RN and Post -op Vital signs reviewed and stable  Post vital signs: Reviewed and stable  Last Vitals:  Vitals Value Taken Time  BP 134/85 10/11/22 0734  Temp    Pulse 53 10/11/22 0739  Resp 17 10/11/22 0739  SpO2 93 % 10/11/22 0739  Vitals shown include unvalidated device data.  Last Pain:  Vitals:   10/11/22 0656  TempSrc: Oral         Complications: No notable events documented.

## 2022-10-11 NOTE — Anesthesia Preprocedure Evaluation (Signed)
Anesthesia Evaluation  Patient identified by MRN, date of birth, ID band Patient awake    Reviewed: Allergy & Precautions, NPO status , Patient's Chart, lab work & pertinent test results  History of Anesthesia Complications Negative for: history of anesthetic complications  Airway Mallampati: III  TM Distance: >3 FB Neck ROM: full    Dental  (+) Chipped   Pulmonary sleep apnea and Continuous Positive Airway Pressure Ventilation    Pulmonary exam normal        Cardiovascular Exercise Tolerance: Good hypertension, On Medications (-) angina (-) DOE + dysrhythmias Atrial Fibrillation  Rhythm:Irregular Rate:Abnormal     Neuro/Psych  PSYCHIATRIC DISORDERS Anxiety Depression     Neuromuscular disease    GI/Hepatic negative GI ROS, Neg liver ROS,,,  Endo/Other  negative endocrine ROSdiabetes  Conn syndrome   Renal/GU negative Renal ROS  negative genitourinary   Musculoskeletal   Abdominal   Peds  Hematology negative hematology ROS (+)   Anesthesia Other Findings Past Medical History: No date: Abnormal results of liver function studies No date: Aldosterone excess (Conn syndrome) (HCC) No date: Anginal pain ([place]) No date: Anxiety No date: Atrial fibrillation and flutter (HCC)     Comment:  a.) CHA2DS2VASc = 2 (HTN, T2DM);  b.) s/p ablation               07/13/2016; c.) rate/rhythm maintained on oral metoprolol              succinate; no chronic anticoagulation No date: Chronic seasonal allergic rhinitis No date: Depression 60/08/9322: Diastolic dysfunction     Comment:  a.) TTE 10/21/2020: EF >55%, mild LVH, mod BAE, triv               AR/MR, mild TR/PR, G2DD No date: DJD (degenerative joint disease) No date: DM2 (diabetes mellitus, type 2) (HCC) No date: History of chicken pox No date: History of syncope No date: HLD (hyperlipidemia) No date: HTN (hypertension) 07/13/2016: Hx of atrioventricular node ablation No  date: Hypersomnia with sleep apnea     Comment:  a.) on nocturnal PAP therapy No date: Lumbar spinal stenosis No date: Psoriasis No date: Seasonal allergies No date: Seborrheic dermatitis  Past Surgical History: 07/13/2016: CARDIAC ELECTROPHYSIOLOGY MAPPING AND ABLATION; N/A 09/28/2022: CARDIOVERSION; N/A     Comment:  Procedure: CARDIOVERSION;  Surgeon: Corey Skains,               MD;  Location: [facility] ORS;  Service: Cardiovascular;                Laterality: N/A; 12/05/2003, 09/05/2002: COLONOSCOPY     Comment:  Dr. Chauncey Cruel. [place] @ [place] - Adenomatous Polyps, [place]               (sister) 12/25/2020: COLONOSCOPY WITH PROPOFOL; N/A     Comment:  Procedure: COLONOSCOPY WITH PROPOFOL;  Surgeon: Robert Bellow, MD;  Location: [facility] ENDOSCOPY;  Service:               Endoscopy;  Laterality: N/A;  COVID POSITIVE ON 11/24/2020 03/10/2016: LAPAROSCOPIC ADRENALECTOMY; Right 03/06/2019: LUMBAR LAMINECTOMY/DECOMPRESSION MICRODISCECTOMY; N/A     Comment:  Procedure: LUMBAR LAMINECTOMY/DECOMPRESSION               MICRODISCECTOMY 2 LEVELS L3-4; L4-5;  Surgeon: Meade Maw, MD;  Location: [facility] ORS;  Service: Neurosurgery;  Laterality: N/A; 08/03/2022: LUMBAR LAMINECTOMY/DECOMPRESSION MICRODISCECTOMY; Left     Comment:  Procedure: LEFT L3-S1 DECOMPRESSION;  Surgeon:               Meade Maw, MD;  Location: [facility] ORS;  Service:               Neurosurgery;  Laterality: Left; 02/12/2014: MEDIAL PARTIAL KNEE REPLACEMENT; Left     Comment:  Procecedure: MEDIAL MAKOPLASTY No date: TONSILLECTOMY No date: UVULOPALATOPHARYNGOPLASTY; N/A No date: VASECTOMY  BMI    Body Mass Index: 32.94 kg/m      Reproductive/Obstetrics negative OB ROS                             Anesthesia Physical Anesthesia Plan  ASA: 3  Anesthesia Plan: General   Post-op Pain Management: Minimal or no pain anticipated   Induction:  Intravenous  PONV Risk Score and Plan: Propofol infusion and TIVA  Airway Management Planned: Natural Airway and Nasal Cannula  Additional Equipment:   Intra-op Plan:   Post-operative Plan:   Informed Consent: I have reviewed the patients History and Physical, chart, labs and discussed the procedure including the risks, benefits and alternatives for the proposed anesthesia with the patient or authorized representative who has indicated his/her understanding and acceptance.     Dental Advisory Given  Plan Discussed with: Anesthesiologist, CRNA and Surgeon  Anesthesia Plan Comments: (Patient consented for risks of anesthesia including but not limited to:  - adverse reactions to medications - risk of airway placement if required - damage to eyes, teeth, lips or other oral mucosa - nerve damage due to positioning  - sore throat or hoarseness - Damage to heart, brain, nerves, lungs, other parts of body or loss of life  Patient voiced understanding.)       Anesthesia Quick Evaluation

## 2022-10-11 NOTE — Anesthesia Postprocedure Evaluation (Signed)
Anesthesia Post Note  Patient: Cory Perez  Procedure(s) Performed: CARDIOVERSION  Patient location during evaluation: [facility] Anesthesia Type: General Level of consciousness: awake and alert Pain management: pain level controlled Vital Signs Assessment: post-procedure vital signs reviewed and stable Respiratory status: spontaneous breathing, nonlabored ventilation, respiratory function stable and patient connected to nasal cannula oxygen Cardiovascular status: blood pressure returned to baseline and stable Postop Assessment: no apparent nausea or vomiting Anesthetic complications: no   No notable events documented.   Last Vitals:  Vitals:   10/11/22 0800 10/11/22 0815  BP: 115/78 105/75  Pulse: 64 64  Resp: 12 (!) 21  Temp:    SpO2: 95% 94%    Last Pain:  Vitals:   10/11/22 0815  TempSrc:   PainSc: 0-No pain                 Ilene Qua

## 2022-10-11 NOTE — Progress Notes (Signed)
Timeout done at this time.

## 2022-10-12 ENCOUNTER — Encounter: Payer: Self-pay | Admitting: Internal Medicine

## 2022-10-19 NOTE — H&P (Signed)
[facility] Consultation Note  Patient ID: Cory Perez, MRN: [id number], DOB/AGE: July 24, 1966 56 y.o. Admit date: 10/11/2022   Date of Consult: 10/19/2022 Primary Physician: Baxter Hire, MD Primary Cardiologist: Nehemiah Massed   HPI: 56 y.o. male with afib and controlled rate  Past Medical History:  Diagnosis Date   Abnormal results of liver function studies    Aldosterone excess (Conn syndrome) ([place])    Anginal pain ([place])    Anxiety    Atrial fibrillation and flutter ([place])    a.) CHA2DS2VASc = 2 (HTN, T2DM);  b.) s/p ablation 07/13/2016; c.) rate/rhythm maintained on oral metoprolol succinate; no chronic anticoagulation   Chronic seasonal allergic rhinitis    Depression    Diastolic dysfunction 44/01/4741   a.) TTE 10/21/2020: EF >55%, mild LVH, mod BAE, triv AR/MR, mild TR/PR, G2DD   DJD (degenerative joint disease)    DM2 (diabetes mellitus, type 2) ([place])    History of chicken pox    History of syncope    HLD (hyperlipidemia)    HTN (hypertension)    Hx of atrioventricular node ablation 07/13/2016   Hypersomnia with sleep apnea    a.) on nocturnal PAP therapy   Lumbar spinal stenosis    Psoriasis    Seasonal allergies    Seborrheic dermatitis       Surgical History:  Past Surgical History:  Procedure Laterality Date   CARDIAC ELECTROPHYSIOLOGY MAPPING AND ABLATION N/A 07/13/2016   CARDIOVERSION N/A 09/28/2022   Procedure: CARDIOVERSION;  Surgeon: Corey Skains, MD;  Location: [facility] ORS;  Service: Cardiovascular;  Laterality: N/A;   CARDIOVERSION N/A 10/11/2022   Procedure: CARDIOVERSION;  Surgeon: Corey Skains, MD;  Location: [facility] OR[facility];  Service: Cardiovascular;  Laterality: N/A;   COLONOSCOPY  12/05/2003, 09/05/2002   Dr. Chauncey Cruel. [place] @ [place] - Adenomatous Polyps, [facility] (sister)   COLONOSCOPY WITH PROPOFOL N/A 12/25/2020   Procedure: COLONOSCOPY WITH PROPOFOL;  Surgeon: Robert Bellow, MD;  Location: [facility];  Service: Endoscopy;   Laterality: N/A;  COVID POSITIVE ON 11/24/2020   LAPAROSCOPIC ADRENALECTOMY Right 03/10/2016   LUMBAR LAMINECTOMY/DECOMPRESSION MICRODISCECTOMY N/A 03/06/2019   Procedure: LUMBAR LAMINECTOMY/DECOMPRESSION MICRODISCECTOMY 2 LEVELS L3-4; L4-5;  Surgeon: Meade Maw, MD;  Location: [facility] OR[facility];  Service: Neurosurgery;  Laterality: N/A;   LUMBAR LAMINECTOMY/DECOMPRESSION MICRODISCECTOMY Left 08/03/2022   Procedure: LEFT L3-S1 DECOMPRESSION;  Surgeon: Meade Maw, MD;  Location: [facility] OR[facility];  Service: Neurosurgery;  Laterality: Left;   MEDIAL PARTIAL KNEE REPLACEMENT Left 02/12/2014   Procecedure: MEDIAL MAKOPLASTY   TONSILLECTOMY     UVULOPALATOPHARYNGOPLASTY N/A    VASECTOMY       Home Meds: Prior to Admission medications   Medication Sig Start Date End Date Taking? Authorizing Provider  baclofen (LIORESAL) 10 MG tablet Take 10 mg by mouth 3 (three) times daily.   Yes [provider]  cholecalciferol (VITAMIN D) 25 MCG (1000 UT) tablet Take 1,000 Units by mouth daily.   Yes [provider]  ELIQUIS 5 MG TABS tablet Take 5 mg by mouth 2 (two) times daily. 09/01/22  Yes [provider]  Empagliflozin-metFORMIN HCl (SYNJARDY) 12.03-999 MG TABS Take 2 tablets by mouth daily.   Yes [provider]  fexofenadine (ALLEGRA) 180 MG tablet Take 180 mg by mouth daily.   Yes [provider]  fluticasone (FLONASE) 50 MCG/ACT nasal spray Place 2 sprays into both nostrils at bedtime.    Yes [provider]  Insulin Pen Needle 31G X 5 MM MISC by Does  not apply route. Could not find exact match. See [facility] chart for where it is listed.   Yes [provider]  metoprolol succinate (TOPROL-XL) 50 MG 24 hr tablet Take 50 mg by mouth daily. Take with or immediately following a meal.   Yes [provider]  pioglitazone (ACTOS) 30 MG tablet Take 30 mg by mouth daily. 07/12/22  Yes [provider]  rosuvastatin (CRESTOR) 10 MG tablet  Take 10 mg by mouth daily.   Yes [provider]  Semaglutide,0.25 or 0.'5MG'$ /DOS, (OZEMPIC, 0.25 OR 0.5 MG/DOSE,) 2 MG/3ML SOPN Inject into the skin.   Yes [provider]  sertraline (ZOLOFT) 100 MG tablet Take 200 mg by mouth at bedtime.    Yes [provider]  vitamin B-12 (CYANOCOBALAMIN) 1000 MCG tablet Take 1,000 mcg by mouth daily.   Yes [provider]    Inpatient Medications:     Allergies:  Allergies  Allergen Reactions   Ketoconazole Hives    Social History   Socioeconomic History   Marital status: Married    Spouse name: Not on file   Number of children: Not on file   Years of education: Not on file   Highest education level: Not on file  Occupational History   Not on file  Tobacco Use   Smoking status: Never   Smokeless tobacco: Never   Tobacco comments:    only smoked for a week  Vaping Use   Vaping Use: Never used  Substance and Sexual Activity   Alcohol use: Not on file    Comment: Rarely   Drug use: Never   Sexual activity: Yes  Other Topics Concern   Not on file  Social History Narrative   Not on file   Social Determinants of Health   Financial Resource Strain: Not on file  Food Insecurity: No Food Insecurity (08/03/2022)   Hunger Vital Sign    Worried About Running Out of Food in the Last Year: Never true    Ran Out of Food in the Last Year: Never true  Transportation Needs: No Transportation Needs (08/03/2022)   PRAPARE - Hydrologist (Medical): No    Lack of Transportation (Non-Medical): No  Physical Activity: Not on file  Stress: Not on file  Social Connections: Not on file  Intimate Partner Violence: Not At Risk (08/03/2022)   Humiliation, Afraid, Rape, and Kick questionnaire    Fear of Current or Ex-Partner: No    Emotionally Abused: No    Physically Abused: No    Sexually Abused: No     No family history on file.   Review of Systems Positive for none Negative  for: General:  chills, fever, night sweats or weight changes.  Cardiovascular: PND orthopnea syncope dizziness  Dermatological skin lesions rashes Respiratory: Cough congestion Urologic: Frequent urination urination at night and hematuria Abdominal: negative for nausea, vomiting, diarrhea, bright red blood per rectum, melena, or hematemesis Neurologic: negative for visual changes, and/or hearing changes  All other systems reviewed and are otherwise negative except as noted above.  Labs: No results for input(s): "CKTOTAL", "CKMB", "TROPONINI" in the last 72 hours. Lab Results  Component Value Date   WBC 5.7 08/29/2022   HGB 15.1 08/29/2022   HCT 44.5 08/29/2022   MCV 95.9 08/29/2022   PLT 103 (L) 08/29/2022   No results for input(s): "NA", "K", "CL", "CO2", "BUN", "CREATININE", "CALCIUM", "PROT", "BILITOT", "ALKPHOS", "ALT", "AST", "GLUCOSE" in the last 168 hours.  Invalid  input(s): "LABALBU" No results found for: "CHOL", "HDL", "LDLCALC", "TRIG" No results found for: "DDIMER"  Radiology/Studies:  No results found.  EKG: afib with controlled rate  Weights: Filed Weights   10/11/22 0656  Weight: 129.3 kg     Physical Exam: Blood pressure 126/65, pulse 63, temperature (!) 97.5 F (36.4 C), temperature source Oral, resp. rate 19, height '6\' 6"'$  (1.981 m), weight 129.3 kg, SpO2 95 %. Body mass index is 32.94 kg/m. General: Well developed, well nourished, in no acute distress. Head eyes ears nose throat: Normocephalic, atraumatic, sclera non-icteric, no xanthomas, nares are without discharge. No apparent thyromegaly and/or mass  Lungs: Normal respiratory effort.  no wheezes, no rales, no rhonchi.  Heart: irrwith normal S1 S2. no murmur gallop, no rub, PMI is normal size and placement, carotid upstroke normal without bruit, jugular venous pressure is normal Abdomen: Soft, non-tender, non-distended with normoactive bowel sounds. No hepatomegaly. No rebound/guarding. No obvious  abdominal masses. Abdominal aorta is normal size without bruit Extremities: No edema. no cyanosis, no clubbing, no ulcers  Peripheral : 2+ bilateral upper extremity pulses, 2+ bilateral femoral pulses, 2+ bilateral dorsal pedal pulse Neuro: Alert and oriented. No facial asymmetry. No focal deficit. Moves all extremities spontaneously. Musculoskeletal: Normal muscle tone without kyphosis Psych:  Responds to questions appropriately with a normal affect.    Assessment: 56yo with afib needing cardioversion  Plan: Cardioversion Continue current meds anticoagulaiton  Signed, Corey Skains M.D. [facility] 10/19/2022, 7:50 AM

## 2022-10-27 ENCOUNTER — Encounter: Payer: Self-pay | Admitting: Neurosurgery

## 2022-10-27 ENCOUNTER — Ambulatory Visit (INDEPENDENT_AMBULATORY_CARE_PROVIDER_SITE_OTHER): Payer: PRIVATE HEALTH INSURANCE | Admitting: Neurosurgery

## 2022-10-27 VITALS — BP 130/70 | Ht 78.0 in | Wt 285.0 lb

## 2022-10-27 DIAGNOSIS — M48062 Spinal stenosis, lumbar region with neurogenic claudication: Secondary | ICD-10-CM

## 2022-10-27 DIAGNOSIS — Z09 Encounter for follow-up examination after completed treatment for conditions other than malignant neoplasm: Secondary | ICD-10-CM

## 2022-10-27 DIAGNOSIS — M5416 Radiculopathy, lumbar region: Secondary | ICD-10-CM

## 2022-10-27 NOTE — Progress Notes (Signed)
   REFERRING PHYSICIAN:  Baxter Hire, Md [place],  [place] [address]  DOS: L3-S1 lumbar decompression including central laminectomy and left laminoforaminotomies on 08/03/22  HISTORY OF PRESENT ILLNESS: Cory Perez is status post L3-S1 lumbar decompression including central laminectomy and left laminoforaminotomies.   He is doing very well.  He has minimal pain.  He is not having some right hip pain from his known hip disease.     PHYSICAL EXAMINATION:  General: Patient is well developed, well nourished, calm, collected, and in no apparent distress.   NEUROLOGICAL:  General: In no acute distress.   Awake, alert, oriented to person, place, and time.  Pupils equal round and reactive to light.  Facial tone is symmetric.  Tongue protrusion is midline.     Strength:          Side Iliopsoas Quads Hamstring PF DF EHL  R '5 5 5 5 5 5  '$ L '5 5 5 5 5 5   '$ Incision c/d/i   ROS (Neurologic):  Negative except as noted above  IMAGING: Nothing new to review.   ASSESSMENT/PLAN:  Cory Perez is doing very well s/p above surgery.  I am very pleased with his response to surgery.  I will see him back as needed.  I have cleared him to go have his right hip evaluated if necessary.  He is released to return to work without restrictions. Meade Maw, MD  Department of neurosurgery

## 2023-02-20 DIAGNOSIS — Z7901 Long term (current) use of anticoagulants: Secondary | ICD-10-CM | POA: Insufficient documentation

## 2023-11-29 ENCOUNTER — Other Ambulatory Visit: Payer: Self-pay | Admitting: Internal Medicine

## 2023-11-29 DIAGNOSIS — I4892 Unspecified atrial flutter: Secondary | ICD-10-CM

## 2023-12-01 ENCOUNTER — Ambulatory Visit
Admission: RE | Admit: 2023-12-01 | Discharge: 2023-12-01 | Disposition: A | Discharge status: Home / Self Care | Payer: Self-pay | Source: Ambulatory Visit | Attending: Internal Medicine | Admitting: Internal Medicine

## 2023-12-01 DIAGNOSIS — I4892 Unspecified atrial flutter: Secondary | ICD-10-CM | POA: Insufficient documentation

## 2023-12-19 ENCOUNTER — Other Ambulatory Visit: Payer: Self-pay | Admitting: Internal Medicine

## 2023-12-19 DIAGNOSIS — R931 Abnormal findings on diagnostic imaging of heart and coronary circulation: Secondary | ICD-10-CM

## 2023-12-27 ENCOUNTER — Other Ambulatory Visit (HOSPITAL_COMMUNITY): Payer: Self-pay | Admitting: *Deleted

## 2023-12-27 ENCOUNTER — Encounter (HOSPITAL_COMMUNITY): Payer: Self-pay

## 2023-12-27 MED ORDER — METOPROLOL TARTRATE 100 MG PO TABS: ORAL_TABLET | ORAL | 0 refills | Status: AC

## 2024-01-01 ENCOUNTER — Ambulatory Visit
Admission: RE | Admit: 2024-01-01 | Discharge: 2024-01-01 | Disposition: A | Discharge status: Home / Self Care | Payer: 59 | Source: Ambulatory Visit | Attending: Internal Medicine | Admitting: Internal Medicine

## 2024-01-01 DIAGNOSIS — R931 Abnormal findings on diagnostic imaging of heart and coronary circulation: Secondary | ICD-10-CM | POA: Insufficient documentation

## 2024-01-01 LAB — POCT I-STAT CREATININE: Creatinine, Ser: 1.1 mg/dL (ref 0.61–1.24)

## 2024-01-01 MED ORDER — IOHEXOL 350 MG/ML SOLN
80.0000 mL | Freq: Once | INTRAVENOUS | Status: AC | PRN
  Administered 2024-01-01: 80 mL via INTRAVENOUS

## 2024-01-01 MED ORDER — NITROGLYCERIN 0.4 MG SL SUBL
SUBLINGUAL_TABLET | SUBLINGUAL | Status: AC
  Filled 2024-01-01: qty 1

## 2024-01-01 MED ORDER — METOPROLOL TARTRATE 5 MG/5ML IV SOLN: 10.0000 mg | Freq: Once | INTRAVENOUS | Status: DC | PRN

## 2024-01-01 MED ORDER — NITROGLYCERIN 0.4 MG SL SUBL: 0.8000 mg | SUBLINGUAL_TABLET | Freq: Once | SUBLINGUAL | Status: DC

## 2024-01-01 MED ORDER — DILTIAZEM HCL 25 MG/5ML IV SOLN: 10.0000 mg | INTRAVENOUS | Status: DC | PRN

## 2024-02-23 DIAGNOSIS — M1611 Unilateral primary osteoarthritis, right hip: Secondary | ICD-10-CM | POA: Insufficient documentation

## 2024-03-01 NOTE — Discharge Instructions (Signed)
 Instructions after Total Hip Replacement   Cory Perez P. Cory Perez., M.D.    Dept. of Orthopaedics & Sports Medicine [facility] [address] [place], [place]  [address]  Phone: [phone]   Fax: [phone]        [email or web address]        DIET: Drink plenty of non-alcoholic fluids. Resume your normal diet. Include foods high in fiber.  ACTIVITY:  You may use crutches or a walker with weight-bearing as tolerated, unless instructed otherwise. You may be weaned off of the walker or crutches by your Physical Therapist.  Do NOT reach below the level of your knees or cross your legs until allowed.    Continue doing gentle exercises. Exercising will reduce the pain and swelling, increase motion, and prevent muscle weakness.   Please continue to use the TED compression stockings for 6 weeks. You may remove the stockings at night, but should reapply them in the morning. Do not drive or operate any equipment until instructed.  WOUND CARE:  Continue to use ice packs periodically to reduce pain and swelling. The initial dressing (Aquacel) can remain in place for 7 days (see separate instructions). Keep the incision clean and dry. You may bathe or shower after the staples are removed at the first office visit following surgery.  MEDICATIONS: You may resume your regular medications. Please take the pain medication as prescribed on the medication. Do not take pain medication on an empty stomach. Unless instructed otherwise, you should take an enteric-coated aspirin 81 mg. TWICE a day. (This along with elevation will help reduce the possibility of blood clots/phlebitis in your operated leg.) Use a stool softener (such as Senokot-S or Colace) daily and a laxative (such as Miralax or Dulcolax) as needed to prevent constipation.  Do not drive or drink alcoholic beverages when taking pain medications.  CALL THE OFFICE FOR: Temperature above 101 degrees Excessive bleeding or drainage  on the dressing. Excessive swelling, coldness, or paleness of the toes. Persistent nausea and vomiting.  FOLLOW-UP:  You should have an appointment to return to the office in 6 weeks after surgery. Arrangements have been made for continuation of Physical Therapy (either home therapy or outpatient therapy).     [facility] Department Directory         [email or web address]       [email or web address]          Cardiology  Appointments: [place] - [phone]  Endocrinology  Appointments: [place] [phone] [place] - [phone]  Gastroenterology  Appointments: [place] [phone] [place] - [phone]        General Surgery   Appointments: [facility]  Internal Medicine/Family Medicine  Appointments: [facility] [place] - [phone] [place] - [phone]  Metabolic and Weigh Loss Surgery  Appointments: [facility]        Neurology  Appointments: [place] [phone] [place] - [phone]  Neurosurgery  Appointments: [place]  Obstetrics & Gynecology  Appointments: [place] [phone] [place] - [phone]        Pediatrics  Appointments: Sherrie Sport [phone] [place] - [phone]  Physiatry  Appointments: [place] [phone]  Physical Therapy  Appointments: [place] - [phone]        Podiatry  Appointments: [place] [phone] [place] - [phone]  Pulmonology  Appointments: [place]  Rheumatology  Appointments: [place] [phone]        [place] Location: [facility]  [address] [place], [place]  [address]  [place]  Location: [facility]. [address] [place], [place]  [address]  [place] Location: [facility] [address] [place], [place]  [address]

## 2024-03-01 NOTE — H&P (Signed)
 ORTHOPAEDIC HISTORY & PHYSICAL Korah Hufstedler, Adelina Mings., MD - 02/23/2024 9:00 AM EDT Formatting of this note is different from the original. Images from the original note were not included. Chief Complaint: Chief Complaint Patient presents with Right hip degenerative arthrosis  Reason for Visit: The patient is a 58 y.o. male who presents today for reevaluation of his right hip. He reports a long history of right hip and groin pain that has progressively worsened over the last year. He now reports relatively constant right hip pain even at rest and at night. The pain is worse with weight bearing. He describes some near giving way of the hip due to the pain. The hip pain limits the patient's ability to ambulate long distances. The patient has not appreciated any significant improvement despite Tylenol and activity modification. He is unable to tolerate NSAIDs due to anticoagulation with Eliquis. He is not using any ambulatory aids. The patient states that the hip pain has progressed to the point that it is significantly interfering with his activities of daily living.  Medications: Current Outpatient Medications Medication Sig Dispense Refill acetaminophen (TYLENOL) 500 mg capsule Take 500 mg by mouth every 4 (four) hours as needed for Fever or Pain. apixaban (ELIQUIS) 5 mg tablet Take 1 tablet (5 mg total) by mouth every 12 (twelve) hours 60 tablet 11 baclofen (LIORESAL) 10 MG tablet TAKE ONE TABLET (10 MG) BY MOUTH 3 TIMESDAILY 90 tablet 1 BD INSULIN PEN NEEDLE UF MINI 31 gauge x 3/16" needle USE AS DIRECTED 100 each 1 BIMZELX AUTOINJECTOR 160 mg/mL AtIn clindamycin (CLEOCIN T) 1 % topical solution Apply 1 Application topically once daily codeine-guaiFENesin 10-100 mg/5 mL oral liquid Take 5 mLs by mouth at bedtime as needed for Cough or Congestion (can take during the day q6hrs if not working or driving) 70 mL 0 cyanocobalamin (VITAMIN B12) 1000 MCG tablet Take 1,000 mcg by mouth once  daily. ergocalciferol, vitamin D2, 1,250 mcg (50,000 unit) capsule take 1 capsule by mouth once a week 12 capsule 3 fexofenadine (ALLEGRA) 180 MG tablet Take 180 mg by mouth once daily as needed (FOR SEASONAL ALLERGIES) fluticasone propionate (FLONASE) 50 mcg/actuation nasal spray 2 SPRAYS IN EACH NOSTRIL EVERY DAY 16 g 5 lisinopriL (ZESTRIL) 2.5 MG tablet Take 1 tablet (2.5 mg total) by mouth once daily 30 tablet 11 meloxicam (MOBIC) 15 MG tablet take 1 tablet by mouth daily 30 tablet 11 metoprolol succinate (TOPROL-XL) 50 MG XL tablet Take 1 tablet (50 mg total) by mouth once daily 90 tablet 3 pioglitazone (ACTOS) 30 MG tablet TAKE 1 TABLET BY MOUTH DAILY 30 tablet 11 rosuvastatin (CRESTOR) 40 MG tablet Take 1 tablet (40 mg total) by mouth once daily 30 tablet 11 sertraline (ZOLOFT) 100 MG tablet TAKE TWO TABLETS EVERY DAY 180 tablet 1 SYNJARDY XR 12.5-1,000 mg XR 24 hr biphasic tablet TAKE 2 TABLETS BY MOUTH EVERY DAY 180 tablet 3 tirzepatide (MOUNJARO) 10 mg/0.5 mL pen injector Inject 0.5 mLs (10 mg total) subcutaneously once a week 2 mL 6 UNABLE TO FIND Inject 320 mg into the muscle every 28 (twenty-eight) days Med Name: Bimzelx blood glucose diagnostic test strip Use 1 each (1 strip total) 3 (three) times daily Use as instructed. (Patient taking differently: Use 1 strip once daily Use as instructed.) 100 each 12  No current facility-administered medications for this visit.  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 Seborrheic dermatitis Sleep apnea  Past Surgical History: Past Surgical History: Procedure Laterality Date JOINT REPLACEMENT Left 02/12/2014 Left knee medial MAKOplasty. ADRENALECTOMY Right 02/2016 COLONOSCOPY 04/21/2017 Dr. Nena Alexander @ [facility] - Adenomatous Polyps, [facility] (Sister): CBF 04/2020 LAMINECTOMY CERVICAL FOR DREZ 02/2019 L3-5 lumbar decompression 03/06/2019 Dr Venetia Night at [facility] COLONOSCOPY 12/25/2020 Tubular adenoma/FHx CC/will need a 2-day prep/Repeat 7yrs/[facility] APPENDECTOMY BACK SURGERY COLONOSCOPY 12/05/2003,09/05/2002 Dr. Kathie Rhodes. Bindrim @ [facility] - Adenomatous Polyps, [facility] (Sister) COLOSTOMY TONSILLECTOMY UPP surgery on throat Vasectomy WISDOM TEETH  Social History: Social History  Socioeconomic History Marital status: Married Spouse name: April Number of children: 3 Years of education: 16 Highest education level: Bachelor's degree (e.g., BA, AB, BS) Occupational History Occupation: Management consultant -[employer] Occupation: STAFF Tobacco Use Smoking status: Never Smokeless tobacco: Never Vaping Use Vaping status: Never Used Substance and Sexual Activity Alcohol use: Yes Comment: Rarely Drug use: No Sexual activity: Yes Partners: Female Social History Narrative Married, 3 daughters  Social Drivers of Catering manager Strain: Low Risk (02/15/2024) Overall Financial Resource Strain (CARDIA) Difficulty of Paying Living Expenses: Not very hard Food Insecurity: No Food Insecurity (02/15/2024) Hunger Vital Sign Worried About Running Out of Food in the Last Year: Never true Ran Out of Food in the Last Year: Never true Transportation Needs: No Transportation Needs (02/15/2024) PRAPARE - Contractor (Medical): No Lack of Transportation (Non-Medical): No Housing Stability: Low Risk  (02/15/2024) Housing Stability Vital Sign Unable to Pay for Housing in the Last Year: No Number of Times Moved in the Last Year: 0 Homeless in the Last Year: No  Family History: Family History Problem Relation Name Age of Onset Diabetes Mother High blood pressure (Hypertension) Mother Myocardial Infarction (Heart attack) Mother 60 CABGx2 Diabetes type II Mother Heart disease Mother Diabetes Father High blood pressure (Hypertension) Father Psoriasis Father Colon cancer Sister Diabetes Brother Psoriasis Brother Diabetes type II Brother Anesthesia problems Neg Hx Malignant hypertension Neg Hx Malignant hyperthermia Neg Hx  Review of Systems: A comprehensive 14 point ROS was performed, reviewed, and the pertinent orthopaedic findings are documented in the HPI.  Exam BP 116/78  Ht 198.1 cm (6\' 6" )  Wt (!) 125.6 kg (276 lb 12.8 oz)  BMI 31.99 kg/m  General: Well-developed, well-nourished male seen in no acute distress. Antalgic gait without evidence of significant abductor lurch.  HEENT: Atraumatic, normocephalic. Pupils are equal and reactive to light. Extraocular motion is intact. Sclera are clear. Oropharynx is clear with moist mucosa.  Neck: Supple, nontender, and with good ROM. No thyromegaly, adenopathy, JVD, or carotid bruits.  Lungs: Clear to auscultation bilaterally.  Cardiovascular: Regular rate and rhythm. Normal S1, S2. No murmur . No appreciable gallops or rubs. Peripheral pulses are palpable. No lower extremity edema. Homan`s test is negative.  Abdomen: Soft, nontender, nondistended. Bowel sounds are present.  Spine: Alignment: No gross scoliosis. Normal lumbar lordosis. Sacroiliac joints: Nontender to palpation Patrick`s test: Negative Flip test: Negative Tenderness: Mild Paraspinous spasm: None Range of motion: Fair range of motion with both flexion and extension  Extremities: Good strength, stability, and range of motion of the upper  extremities. Good range of motion of the knees and ankles.  Right Hip:  Pelvic tilt: Negative Limb lengths: Equal with the patient standing Soft tissue swelling: Negative Erythema: Negative Crepitance: Mild Tenderness: Greater trochanter is nontender to palpation. Severe pain is elicited by axial compression or extremes of rotation. Atrophy: No atrophy. Good hip flexor and abductor strength. Range of Motion: EXT/FLEX: 0/10/110 ADD/ABD: 20/0/20 IR/ER: 0/0/40  Vascular: Peripheral pulses are palpable. Good capillary refill. No gross pretibial or ankle edema. Homans test is negative.  Neurologic: Awake, alert, and oriented. Sensory function is intact to pinprick and light touch. Motor strength is judged to be 5/5. Motor coordination is grossly within normal limits. No apparent clonus. No tremor. Deep tendon reflexes are symmetric.  X-rays: I reviewed the right hip radiographs that were performed at [facility] on 02/15/2024. There is significant narrowing of the cartilage space with bone-on-bone articulation superiorly. Subchondral sclerosis is noted. Osteophyte formation is present.  Impression: Degenerative arthrosis of the right hip  Plan: The findings were discussed in detail with the patient. The patient was given informational material on total hip replacement. Conservative treatment options were reviewed with the patient. We discussed the risks and benefits of surgical intervention. The usual perioperative course was also discussed in detail. The patient expressed understanding of the risks and benefits of surgical intervention and would like to proceed with plans for right total hip arthroplasty.  Hemoglobin A1c is an indication of glucose control. Uncontrolled diabetes has been associated with perioperative complications including poor wound healing and surgical site infections. To decrease the risk of perioperative complications, hemoglobin A1c must be less than 8.0 prior  to surgery. The patient is encouraged to work with his primary care physician and/or endocrinologist to optimize glucose management.  Lab Results Component Value Date HGBA1C 7.4 (H) 01/17/2024 HGBA1C 7.2 (H) 10/18/2023  Anesthesiology requests that patients stop Eliquis 3 days prior to surgery in order to be considered for regional anesthesia. Anticoagulation would be restarted postoperatively. I also recommended holding the Bimzelx for 1 month prior to surgery.  I spent a total of 45 minutes in both face-to-face and non-face-to-face activities, excluding procedures performed, for this visit on the date of this encounter.  MEDICAL CLEARANCE: Per anesthesiology and Dr. Juliann Pares (Cardiology) ACTIVITIES: As tolerated. WORK STATUS: Anticipate out of work for 6-8 weeks following surgery. THERAPY: Preoperative physical therapy evaluation. MEDICATIONS: Requested Prescriptions  No prescriptions requested or ordered in this encounter  FOLLOW-UP: No follow-ups on file.  Zyion Leidner P. [name], M.D.  This note was generated in part with voice recognition software and I apologize for any typographical errors that were not detected and corrected. Electronically signed by Shari Heritage., MD at 02/23/2024 10:13 AM EDT

## 2024-03-07 ENCOUNTER — Encounter
Admission: RE | Admit: 2024-03-07 | Discharge: 2024-03-07 | Disposition: A | Discharge status: Home / Self Care | Source: Ambulatory Visit | Attending: Orthopedic Surgery | Admitting: Orthopedic Surgery

## 2024-03-07 ENCOUNTER — Other Ambulatory Visit: Payer: Self-pay

## 2024-03-07 DIAGNOSIS — E1142 Type 2 diabetes mellitus with diabetic polyneuropathy: Secondary | ICD-10-CM | POA: Diagnosis not present

## 2024-03-07 DIAGNOSIS — M1611 Unilateral primary osteoarthritis, right hip: Secondary | ICD-10-CM | POA: Diagnosis not present

## 2024-03-07 DIAGNOSIS — E2609 Other primary hyperaldosteronism: Secondary | ICD-10-CM | POA: Insufficient documentation

## 2024-03-07 DIAGNOSIS — L409 Psoriasis, unspecified: Secondary | ICD-10-CM | POA: Insufficient documentation

## 2024-03-07 DIAGNOSIS — Z01818 Encounter for other preprocedural examination: Secondary | ICD-10-CM

## 2024-03-07 DIAGNOSIS — Z01812 Encounter for preprocedural laboratory examination: Secondary | ICD-10-CM | POA: Insufficient documentation

## 2024-03-07 HISTORY — DX: Abnormal findings on diagnostic imaging of heart and coronary circulation: R93.1

## 2024-03-07 HISTORY — DX: Obesity, unspecified: E66.9

## 2024-03-07 HISTORY — DX: Polyneuropathy, unspecified: G62.9

## 2024-03-07 HISTORY — DX: Unilateral primary osteoarthritis, right hip: M16.11

## 2024-03-07 LAB — TYPE AND SCREEN
ABO/RH(D): A POS
Antibody Screen: NEGATIVE

## 2024-03-07 LAB — CBC
HCT: 42.4 % (ref 39.0–52.0)
Hemoglobin: 14.6 g/dL (ref 13.0–17.0)
MCH: 32.1 pg (ref 26.0–34.0)
MCHC: 34.4 g/dL (ref 30.0–36.0)
MCV: 93.2 fL (ref 80.0–100.0)
Platelets: 99 10*3/uL — ABNORMAL LOW (ref 150–400)
RBC: 4.55 MIL/uL (ref 4.22–5.81)
RDW: 13 % (ref 11.5–15.5)
WBC: 4.8 10*3/uL (ref 4.0–10.5)
nRBC: 0 % (ref 0.0–0.2)

## 2024-03-07 LAB — COMPREHENSIVE METABOLIC PANEL WITH GFR
ALT: 39 U/L (ref 0–44)
AST: 36 U/L (ref 15–41)
Albumin: 4.3 g/dL (ref 3.5–5.0)
Alkaline Phosphatase: 35 U/L — ABNORMAL LOW (ref 38–126)
Anion gap: 7 (ref 5–15)
BUN: 23 mg/dL — ABNORMAL HIGH (ref 6–20)
CO2: 24 mmol/L (ref 22–32)
Calcium: 9.2 mg/dL (ref 8.9–10.3)
Chloride: 104 mmol/L (ref 98–111)
Creatinine, Ser: 1.09 mg/dL (ref 0.61–1.24)
GFR, Estimated: >60 mL/min (ref >60)
Glucose, Bld: 131 mg/dL — ABNORMAL HIGH (ref 70–99)
Potassium: 4.4 mmol/L (ref 3.5–5.1)
Sodium: 135 mmol/L (ref 135–145)
Total Bilirubin: 1.1 mg/dL (ref 0.0–1.2)
Total Protein: 7.3 g/dL (ref 6.5–8.1)

## 2024-03-07 LAB — HEMOGLOBIN A1C
Hgb A1c MFr Bld: 6.5 % — ABNORMAL HIGH (ref 4.8–5.6)
Mean Plasma Glucose: 139.85 mg/dL

## 2024-03-07 LAB — URINALYSIS, ROUTINE W REFLEX MICROSCOPIC
Bacteria, UA: NONE SEEN
Bilirubin Urine: NEGATIVE
Glucose, UA: >=500 mg/dL — AB
Hgb urine dipstick: NEGATIVE
Ketones, ur: NEGATIVE mg/dL
Leukocytes,Ua: NEGATIVE
Nitrite: NEGATIVE
Protein, ur: NEGATIVE mg/dL
Specific Gravity, Urine: 1.021 (ref 1.005–1.030)
Squamous Epithelial / HPF: 0 /HPF (ref 0–5)
WBC, UA: 0 WBC/hpf (ref 0–5)
pH: 5 (ref 5.0–8.0)

## 2024-03-07 LAB — SURGICAL PCR SCREEN
MRSA, PCR: NEGATIVE
Staphylococcus aureus: POSITIVE — AB

## 2024-03-07 LAB — SEDIMENTATION RATE: Sed Rate: 8 mm/h (ref 0–20)

## 2024-03-07 LAB — C-REACTIVE PROTEIN: CRP: 0.5 mg/dL (ref <1.0)

## 2024-03-07 NOTE — Patient Instructions (Addendum)
 Your procedure is scheduled on:03-13-34 Wednesday Report to the Registration Desk on the 1st floor of the [facility].Then proceed to the 2nd floor Surgery Desk To find out your arrival time, please call [phone] between 1PM - 3PM on:03-12-24 Tuesday If your arrival time is 6:00 am, do not arrive before that time as the [facility] entrance doors do not open until 6:00 am.  REMEMBER: Instructions that are not followed completely may result in serious medical risk, up to and including death; or upon the discretion of your surgeon and anesthesiologist your surgery may need to be rescheduled.  Do not eat food after midnight the night before surgery.  No gum chewing or hard candies.  You may however, drink Water up to 2 hours before you are scheduled to arrive for your surgery. Do not drink anything within 2 hours of your scheduled arrival time.  In addition, your doctor has ordered for you to drink the provided:  Gatorade G2 Drinking this carbohydrate drink up to two hours before surgery helps to reduce insulin resistance and improve patient outcomes. Please complete drinking 2 hours before scheduled arrival time.  One week prior to surgery:Stop NOW (03-07-24) Stop Anti-inflammatories (NSAIDS) such as Advil, Aleve, Ibuprofen, Motrin, Naproxen, Naprosyn and Aspirin based products such as Excedrin, Goody's Powder, BC Powder. Stop ANY OVER THE COUNTER supplements until after surgery (Vitamin B12)  You may however, continue to take Tylenol if needed for pain up until the day of surgery.  Stop tirzepatide [facility]) 7 days prior to surgery-Do NOT take again until AFTER surgery  Stop SYNJARDY XR 3 days prior to surgery-Last dose will be on 03-09-24 Saturday  Stop ELIQUIS 3 days prior to surgery-Last dose will be on 03-09-24 Saturday  Hold bimekizumab-bkzx [facility]) 1 month prior to surgery as instructed by Dr Aubry Blase  Continue taking all of your other prescription medications up until the day  of surgery.  ON THE DAY OF SURGERY ONLY TAKE THESE MEDICATIONS WITH SIPS OF WATER: -fexofenadine (ALLEGRA)  -metoprolol succinate (TOPROL-XL)  -rosuvastatin (CRESTOR)   No Alcohol for 24 hours before or after surgery.  No Smoking including e-cigarettes for 24 hours before surgery.  No chewable tobacco products for at least 6 hours before surgery.  No nicotine patches on the day of surgery.  Do not use any "recreational" drugs for at least a week (preferably 2 weeks) before your surgery.  Please be advised that the combination of cocaine and anesthesia may have negative outcomes, up to and including death. If you test positive for cocaine, your surgery will be cancelled.  On the morning of surgery brush your teeth with toothpaste and water, you may rinse your mouth with mouthwash if you wish. Do not swallow any toothpaste or mouthwash.  Use CHG Soap as directed on instruction sheet.  Do not wear jewelry, make-up, hairpins, clips or nail polish.  For welded (permanent) jewelry: bracelets, anklets, waist bands, etc.  Please have this removed prior to surgery.  If it is not removed, there is a chance that hospital personnel will need to cut it off on the day of surgery.  Do not wear lotions, powders, or perfumes.   Do not shave body hair from the neck down 48 hours before surgery.  Contact lenses, hearing aids and dentures may not be worn into surgery.  Do not bring valuables to the hospital. [facility] is not responsible for any missing/lost belongings or valuables.   Bring your C-PAP to the hospital   Notify  your doctor if there is any change in your medical condition (cold, fever, infection).  Wear comfortable clothing (specific to your surgery type) to the hospital.  After surgery, you can help prevent lung complications by doing breathing exercises.  Take deep breaths and cough every 1-2 hours. Your doctor may order a device called an Incentive Spirometer to help you take  deep breaths. When coughing or sneezing, hold a pillow firmly against your incision with both hands. This is called "splinting." Doing this helps protect your incision. It also decreases belly discomfort.  If you are being admitted to the hospital overnight, leave your suitcase in the car. After surgery it may be brought to your room.  In case of increased patient census, it may be necessary for you, the patient, to continue your postoperative care in the Same Day Surgery department.  If you are being discharged the day of surgery, you will not be allowed to drive home. You will need a responsible individual to drive you home and stay with you for 24 hours after surgery.   If you are taking public transportation, you will need to have a responsible individual with you.  Please call the Pre-admissions Testing Dept. at [phone] if you have any questions about these instructions.  Surgery Visitation Policy:  Patients having surgery or a procedure may have two visitors.  Children under the age of 43 must have an adult with them who is not the patient.  Inpatient Visitation:    Visiting hours are 7 a.m. to 8 p.m. Up to four visitors are allowed at one time in a patient room. The visitors may rotate out with other people during the day.  One visitor age 58 or older may stay with the patient overnight and must be in the room by 8 p.m.    Pre-operative 5 CHG Bath Instructions   You can play a key role in reducing the risk of infection after surgery. Your skin needs to be as free of germs as possible. You can reduce the number of germs on your skin by washing with CHG (chlorhexidine gluconate) soap before surgery. CHG is an antiseptic soap that kills germs and continues to kill germs even after washing.   DO NOT use if you have an allergy to chlorhexidine/CHG or antibacterial soaps. If your skin becomes reddened or irritated, stop using the CHG and notify one of our RNs at [phone].    Please shower with the CHG soap starting 4 days before surgery using the following schedule:     Please keep in mind the following:  DO NOT shave, including legs and underarms, starting the day of your first shower.   You may shave your face at any point before/day of surgery.  Place clean sheets on your bed the day you start using CHG soap. Use a clean washcloth (not used since being washed) for each shower. DO NOT sleep with pets once you start using the CHG.   CHG Shower Instructions:  If you choose to wash your hair and private area, wash first with your normal shampoo/soap.  After you use shampoo/soap, rinse your hair and body thoroughly to remove shampoo/soap residue.  Turn the water OFF and apply about 3 tablespoons (45 ml) of CHG soap to a CLEAN washcloth.  Apply CHG soap ONLY FROM YOUR NECK DOWN TO YOUR TOES (washing for 3-5 minutes)  DO NOT use CHG soap on face, private areas, open wounds, or sores.  Pay special attention to the  area where your surgery is being performed.  If you are having back surgery, having someone wash your back for you may be helpful. Wait 2 minutes after CHG soap is applied, then you may rinse off the CHG soap.  Pat dry with a clean towel  Put on clean clothes/pajamas   If you choose to wear lotion, please use ONLY the CHG-compatible lotions on the back of this paper.     Additional instructions for the day of surgery: DO NOT APPLY any lotions, deodorants, cologne, or perfumes.   Put on clean/comfortable clothes.  Brush your teeth.  Ask your nurse before applying any prescription medications to the skin.      CHG Compatible Lotions   Aveeno Moisturizing lotion  Cetaphil Moisturizing Cream  Cetaphil Moisturizing Lotion  Clairol Herbal Essence Moisturizing Lotion, Dry Skin  Clairol Herbal Essence Moisturizing Lotion, Extra Dry Skin  Clairol Herbal Essence Moisturizing Lotion, Normal Skin  Curel Age Defying Therapeutic Moisturizing Lotion  with Alpha Hydroxy  Curel Extreme Care Body Lotion  Curel Soothing Hands Moisturizing Hand Lotion  Curel Therapeutic Moisturizing Cream, Fragrance-Free  Curel Therapeutic Moisturizing Lotion, Fragrance-Free  Curel Therapeutic Moisturizing Lotion, Original Formula  Eucerin Daily Replenishing Lotion  Eucerin Dry Skin Therapy Plus Alpha Hydroxy Crme  Eucerin Dry Skin Therapy Plus Alpha Hydroxy Lotion  Eucerin Original Crme  Eucerin Original Lotion  Eucerin Plus Crme Eucerin Plus Lotion  Eucerin TriLipid Replenishing Lotion  Keri Anti-Bacterial Hand Lotion  Keri Deep Conditioning Original Lotion Dry Skin Formula Softly Scented  Keri Deep Conditioning Original Lotion, Fragrance Free Sensitive Skin Formula  Keri Lotion Fast Absorbing Fragrance Free Sensitive Skin Formula  Keri Lotion Fast Absorbing Softly Scented Dry Skin Formula  Keri Original Lotion  Keri Skin Renewal Lotion Keri Silky Smooth Lotion  Keri Silky Smooth Sensitive Skin Lotion  Nivea Body Creamy Conditioning Oil  Nivea Body Extra Enriched Lotion  Nivea Body Original Lotion  Nivea Body Sheer Moisturizing Lotion Nivea Crme  Nivea Skin Firming Lotion  NutraDerm 30 Skin Lotion  NutraDerm Skin Lotion  NutraDerm Therapeutic Skin Cream  NutraDerm Therapeutic Skin Lotion  ProShield Protective Hand Cream  Provon moisturizing lotion  How to Use an Incentive Spirometer An incentive spirometer is a tool that measures how well you are filling your lungs with each breath. Learning to take long, deep breaths using this tool can help you keep your lungs clear and active. This may help to reverse or lessen your chance of developing breathing (pulmonary) problems, especially infection. You may be asked to use a spirometer: After a surgery. If you have a lung problem or a history of smoking. After a long period of time when you have been unable to move or be active. If the spirometer includes an indicator to show the highest number  that you have reached, your health care provider or respiratory therapist will help you set a goal. Keep a log of your progress as told by your health care provider. What are the risks? Breathing too quickly may cause dizziness or cause you to pass out. Take your time so you do not get dizzy or light-headed. If you are in pain, you may need to take pain medicine before doing incentive spirometry. It is harder to take a deep breath if you are having pain. How to use your incentive spirometer  Sit up on the edge of your bed or on a chair. Hold the incentive spirometer so that it is in an upright position. Before you use  the spirometer, breathe out normally. Place the mouthpiece in your mouth. Make sure your lips are closed tightly around it. Breathe in slowly and as deeply as you can through your mouth, causing the piston or the ball to rise toward the top of the chamber. Hold your breath for 3-5 seconds, or for as long as possible. If the spirometer includes a coach indicator, use this to guide you in breathing. Slow down your breathing if the indicator goes above the marked areas. Remove the mouthpiece from your mouth and breathe out normally. The piston or ball will return to the bottom of the chamber. Rest for a few seconds, then repeat the steps 10 or more times. Take your time and take a few normal breaths between deep breaths so that you do not get dizzy or light-headed. Do this every 1-2 hours when you are awake. If the spirometer includes a goal marker to show the highest number you have reached (best effort), use this as a goal to work toward during each repetition. After each set of 10 deep breaths, cough a few times. This will help to make sure that your lungs are clear. If you have an incision on your chest or abdomen from surgery, place a pillow or a rolled-up towel firmly against the incision when you cough. This can help to reduce pain while taking deep breaths and coughing. General  tips When you are able to get out of bed: Walk around often. Continue to take deep breaths and cough in order to clear your lungs. Keep using the incentive spirometer until your health care provider says it is okay to stop using it. If you have been in the hospital, you may be told to keep using the spirometer at home. Contact a health care provider if: You are having difficulty using the spirometer. You have trouble using the spirometer as often as instructed. Your pain medicine is not giving enough relief for you to use the spirometer as told. You have a fever. Get help right away if: You develop shortness of breath. You develop a cough with bloody mucus from the lungs. You have fluid or blood coming from an incision site after you cough. Summary An incentive spirometer is a tool that can help you learn to take long, deep breaths to keep your lungs clear and active. You may be asked to use a spirometer after a surgery, if you have a lung problem or a history of smoking, or if you have been inactive for a long period of time. Use your incentive spirometer as instructed every 1-2 hours while you are awake. If you have an incision on your chest or abdomen, place a pillow or a rolled-up towel firmly against your incision when you cough. This will help to reduce pain. Get help right away if you have shortness of breath, you cough up bloody mucus, or blood comes from your incision when you cough. This information is not intended to replace advice given to you by your health care provider. Make sure you discuss any questions you have with your health care provider. Document Revised: 09/15/2023 Document Reviewed: 09/15/2023 Elsevier Patient Education  2024 [employer]  Preoperative Educational Videos for Total Hip, Knee and Shoulder Replacements  To better prepare for surgery, please view our videos that explain the physical activity and discharge planning required to have the best surgical  recovery at [facility].  [email or web address]  Questions? Call [phone] or email [email or web address]

## 2024-03-07 NOTE — Progress Notes (Signed)
  [facility] Perioperative Services: Pre-Admission/Anesthesia Testing  Abnormal Lab Notification   Date: 03/07/24  Name: Cory Perez MRN:   [id number]  Re: Abnormal labs noted during PAT appointment   Notified:    Provider Name Provider Role Notification Mode  Hooten, Robbie Chiles, MD Orthopedics (Surgeon) Routed and/or faxed via [facility]   ABNORMAL LAB VALUE(S):   Lab Results  Component Value Date   PLT 99 (L) 03/07/2024   Clinical Information and Notes:  Cory Perez is scheduled for a ARTHROPLASTY, HIP, TOTAL,POSTERIOR APPROACH (Right: Hip) on 03/13/2024.   PMH (+) for thrombocytopenia. Platelet count range 103-146 over course of last 5 years. Unsure if this has been worked up. Today's preoperative platelet count of 99 K/uL is the lowest documented value that I see recorded. He is above the threshold for proceeding with planned elective orthopedic surgery as scheduled. Notice is being sent to ensure that surgeon is aware of platelet count < 100,000.   No further needs from PAT department at this time.   Renate Caroline, MSN, APRN, FNP-C, CEN [facility]  Perioperative Services Nurse Practitioner Phone: [phone] Fax: [phone] 03/07/24 3:09 PM

## 2024-03-08 ENCOUNTER — Encounter: Payer: Self-pay | Admitting: Orthopedic Surgery

## 2024-03-08 NOTE — Progress Notes (Signed)
 Perioperative / Anesthesia Services  Pre-Admission Testing Clinical Review / Pre-Operative Anesthesia Consult  Date: 03/08/24  Patient Demographics:  Name: Cory Perez DOB: [birth year] MRN:   [id number]  Planned Surgical Procedure(s):    Case: [id number] Date/Time: 03/13/24 1115   Procedure: ARTHROPLASTY, HIP, TOTAL,POSTERIOR APPROACH (Right: Hip)   Anesthesia type: Choice   Diagnosis: Primary osteoarthritis of right hip [M16.11]   Pre-op diagnosis: PRIMARY OSTEOARTHRITIS OF RIGHT HIP.   Location: [facility] OR ROOM 01 / [facility]   Surgeons: Arlyne Lame, MD      NOTE: Available PAT nursing documentation and vital signs have been reviewed. Clinical nursing staff has updated patient's PMH/PSHx, current medication list, and drug allergies/intolerances to ensure comprehensive history available to assist in medical decision making as it pertains to the aforementioned surgical procedure and anticipated anesthetic course. Extensive review of available clinical information personally performed. [place] PMH and PSHx updated with any diagnoses/procedures that  may have been inadvertently omitted during his intake with the pre-admission testing department's nursing staff.  Clinical Discussion:  Cory Perez is a 58 y.o. male who is submitted for pre-surgical anesthesia review and clearance prior to him undergoing the above procedure. Patient has never been a smoker in the past. Pertinent PMH includes:  CAD, atrial fibrillation/flutter (s/p ablation), diastolic dysfunction, angina, aortic atherosclerosis, HTN, HLD, T2DM, OSAH (requires nocturnal PAP therapy), chronic thrombocytopenia, Conn syndrome, OA, lumbar spinal stenosis, DJD, diabetic polyneuropathy, anxiety, depression.   Patient is followed by cardiology Beau Bound, MD). He was last seen in the cardiology clinic on ***; notes reviewed. ***At the time of his clinic visit, patient doing well overall from a cardiovascular  perspective. Patient denied any chest pain, shortness of breath, PND, orthopnea, palpitations, significant peripheral edema, weakness, fatigue, vertiginous symptoms, or presyncope/syncope. Patient with a past medical history significant for cardiovascular diagnoses. Documented physical exam was grossly benign, providing no evidence of acute exacerbation and/or decompensation of the patient's known cardiovascular conditions.  ***  Patient with an atrial fibrillation/flutter diagnosis; CHA2DS2-VASc Score = 3 (HTN, vascular disease, T2DM).patient underwent cardiac ablation procedure on *** His rate and rhythm are currently being maintained on oral metoprolol  succinate.  ***  Cory Perez is scheduled for an elective ARTHROPLASTY, HIP, TOTAL,POSTERIOR APPROACH (Right: Hip) on 03/13/2024 with Dr. Alveria Johann.  Given patient's past medical history significant for cardiovascular diagnoses, presurgical cardiac clearance was sought by the PAT team. ***   Again, this patient is on daily oral anticoagulation therapy using a DOAC medication.  He has been instructed on recommendations for holding his apixaban  for 3 days prior to his procedure with plans to restart as soon as postoperative bleeding risk felt to be minimized by his attending surgeon. The patient has been instructed that his last dose of apixaban  should be on 03/09/2024.Cory Perez  Patient denies previous perioperative complications with anesthesia in the past. In review his EMR, it is noted that patient underwent a general anesthetic course here at [facility] (ASA III) in 09/2022 without documented complications.      03/07/2024   10:08 AM 01/01/2024    8:17 AM 01/01/2024    8:07 AM  Vitals with BMI  Height 6\' 6"     Weight 280 lbs    BMI 32.36    Systolic 119 131 657  Diastolic 74 85 93  Pulse 54  64   Providers/Specialists:  NOTE: Primary physician provider listed below. Patient may have been seen by APP or  partner within same practice.   PROVIDER ROLE / SPECIALTY LAST OV  Hooten, Robbie Chiles, MD Orthopedics (Surgeon) 02/23/2024  Little Riff, MD Primary Care Provider 10/25/2023  Thais Fill, MD Cardiology 01/09/2024  Tara Fanti, MD Endocrinology 01/09/2024  Earvin Goldberg, MD Physiatry 05/27/2022   Allergies:   Allergies  Allergen Reactions   Ketoconazole Hives   Current Home Medications:   No current facility-administered medications for this encounter.    acetaminophen  (TYLENOL ) 500 MG tablet   baclofen  (LIORESAL ) 10 MG tablet   bimekizumab-bkzx (BIMZELX) 160 MG/ML prefilled syringe   ELIQUIS  5 MG TABS tablet   fexofenadine (ALLEGRA) 180 MG tablet   fluticasone  (FLONASE ) 50 MCG/ACT nasal spray   lisinopril  (ZESTRIL ) 2.5 MG tablet   metoprolol  succinate (TOPROL -XL) 50 MG 24 hr tablet   pioglitazone  (ACTOS ) 30 MG tablet   rosuvastatin  (CRESTOR ) 40 MG tablet   sertraline  (ZOLOFT ) 100 MG tablet   SYNJARDY  XR 12.03-999 MG TB24   tirzepatide (MOUNJARO) 10 MG/0.5ML Pen   vitamin B-12 (CYANOCOBALAMIN) 1000 MCG tablet   Vitamin D, Ergocalciferol, (DRISDOL) 1.25 MG (50000 UNIT) CAPS capsule   Insulin  Pen Needle 31G X 5 MM MISC   metoprolol  tartrate (LOPRESSOR ) 100 MG tablet   History:   Past Medical History:  Diagnosis Date   Aldosterone excess (Conn syndrome) (HCC)    Angina pectoris (HCC)    Anginal pain (HCC)    Anxiety    Atrial fibrillation and flutter (HCC)    a.) CHA2DS2VASc = 2 (HTN, T2DM);  b.) s/p ablation 07/13/2016; c.) rate/rhythm maintained on oral metoprolol  succinate; no chronic anticoagulation   Chronic seasonal allergic rhinitis    Depression    Diabetic polyneuropathy (HCC)    Diastolic dysfunction 10/21/2020   a.) TTE 10/21/2020: EF >55%, mild LVH, mod BAE, triv AR/MR, mild TR/PR, G2DD   DJD (degenerative joint disease)    DM2 (diabetes mellitus, type 2) (HCC)    Elevated coronary artery calcium  score    History of chicken pox     History of syncope    HLD (hyperlipidemia)    HTN (hypertension)    Hx of atrioventricular node ablation 07/13/2016   Hypersomnia with sleep apnea    a.) on nocturnal PAP therapy   Long-term current use of immunosuppressive biologic agent    a.) bimekizumab-bkzx (BIMZELX) for psoriasis   Lumbar spinal stenosis    Neuropathy    Obesity    On apixaban  therapy    OSA on CPAP    Osteoarthritis of right hip    Psoriasis    a.) Tx'd with bimekizumab-bkzx (BIMZELX)   Seasonal allergies    Seborrheic dermatitis    Vitamin B12 deficiency    Vitamin D deficiency    Past Surgical History:  Procedure Laterality Date   CARDIAC ELECTROPHYSIOLOGY MAPPING AND ABLATION N/A 07/13/2016   CARDIOVERSION N/A 09/28/2022   Procedure: CARDIOVERSION;  Surgeon: Michelle Aid, MD;  Location: [facility] ORS;  Service: Cardiovascular;  Laterality: N/A;   CARDIOVERSION N/A 10/11/2022   Procedure: CARDIOVERSION;  Surgeon: Michelle Aid, MD;  Location: [facility] ORS;  Service: Cardiovascular;  Laterality: N/A;   COLONOSCOPY  12/05/2003, 09/05/2002   Dr. Laneta Pintos. [place] @ [facility] - Adenomatous Polyps, [facility] (sister)   COLONOSCOPY WITH PROPOFOL  N/A 12/25/2020   Procedure: COLONOSCOPY WITH PROPOFOL ;  Surgeon: Marshall Skeeter, MD;  Location: [facility] ENDOSCOPY;  Service: Endoscopy;  Laterality: N/A;  COVID POSITIVE ON 11/24/2020   LAPAROSCOPIC ADRENALECTOMY Right 03/10/2016   LUMBAR LAMINECTOMY/DECOMPRESSION MICRODISCECTOMY N/A 03/06/2019  Procedure: LUMBAR LAMINECTOMY/DECOMPRESSION MICRODISCECTOMY 2 LEVELS L3-4; L4-5;  Surgeon: Jodeen Munch, MD;  Location: [facility] ORS;  Service: Neurosurgery;  Laterality: N/A;   LUMBAR LAMINECTOMY/DECOMPRESSION MICRODISCECTOMY Left 08/03/2022   Procedure: LEFT L3-S1 DECOMPRESSION;  Surgeon: Jodeen Munch, MD;  Location: [facility] ORS;  Service: Neurosurgery;  Laterality: Left;   MEDIAL PARTIAL KNEE REPLACEMENT Left 02/12/2014   Procecedure: MEDIAL MAKOPLASTY   TONSILLECTOMY      UVULOPALATOPHARYNGOPLASTY N/A    VASECTOMY     No family history on file. Social History   Tobacco Use   Smoking status: Never   Smokeless tobacco: Never   Tobacco comments:    only smoked for a week  Substance Use Topics   Alcohol use: Not on file    Comment: Rarely   Pertinent Clinical Results:  LABS:  Hospital Outpatient Visit on 03/07/2024  Component Date Value Ref Range Status   WBC 03/07/2024 4.8  4.0 - 10.5 K/uL Final   RBC 03/07/2024 4.55  4.22 - 5.81 MIL/uL Final   Hemoglobin 03/07/2024 14.6  13.0 - 17.0 g/dL Final   HCT 32/44/0102 42.4  39.0 - 52.0 % Final   MCV 03/07/2024 93.2  80.0 - 100.0 fL Final   MCH 03/07/2024 32.1  26.0 - 34.0 pg Final   MCHC 03/07/2024 34.4  30.0 - 36.0 g/dL Final   RDW 72/53/6644 13.0  11.5 - 15.5 % Final   REPEATED TO VERIFY   Platelets 03/07/2024 99 (L)  150 - 400 K/uL Final   Comment: Immature Platelet Fraction may be clinically indicated, consider ordering this additional test [id number]    nRBC 03/07/2024 0.0  0.0 - 0.2 % Final   Performed at [facility], [address] [place], [place] [address]   Sodium 03/07/2024 135  135 - 145 mmol/L Final   Potassium 03/07/2024 4.4  3.5 - 5.1 mmol/L Final   Chloride 03/07/2024 104  98 - 111 mmol/L Final   CO2 03/07/2024 24  22 - 32 mmol/L Final   Glucose, Bld 03/07/2024 131 (H)  70 - 99 mg/dL Final   Glucose reference range applies only to samples taken after fasting for at least 8 hours.   BUN 03/07/2024 23 (H)  6 - 20 mg/dL Final   Creatinine, Ser 03/07/2024 1.09  0.61 - 1.24 mg/dL Final   Calcium  03/07/2024 9.2  8.9 - 10.3 mg/dL Final   Total Protein 56/43/3295 7.3  6.5 - 8.1 g/dL Final   Albumin 18/84/1660 4.3  3.5 - 5.0 g/dL Final   AST 63/11/6008 36  15 - 41 U/L Final   ALT 03/07/2024 39  0 - 44 U/L Final   Alkaline Phosphatase 03/07/2024 35 (L)  38 - 126 U/L Final   Total Bilirubin 03/07/2024 1.1  0.0 - 1.2 mg/dL Final   GFR, Estimated 03/07/2024 >60  >60 mL/min Final    Comment: (NOTE) Calculated using the CKD-EPI Creatinine Equation (2021)    Anion gap 03/07/2024 7  5 - 15 Final   Performed at [facility], [address] [place], [place] [address]   Color, Urine 03/07/2024 YELLOW (A)  YELLOW Final   APPearance 03/07/2024 CLEAR (A)  CLEAR Final   Specific Gravity, Urine 03/07/2024 1.021  1.005 - 1.030 Final   pH 03/07/2024 5.0  5.0 - 8.0 Final   Glucose, UA 03/07/2024 >=500 (A)  NEGATIVE mg/dL Final   Hgb urine dipstick 03/07/2024 NEGATIVE  NEGATIVE Final   Bilirubin Urine 03/07/2024 NEGATIVE  NEGATIVE Final   Ketones, ur  03/07/2024 NEGATIVE  NEGATIVE mg/dL Final   Protein, ur 16/08/9603 NEGATIVE  NEGATIVE mg/dL Final   Nitrite 54/07/8118 NEGATIVE  NEGATIVE Final   Leukocytes,Ua 03/07/2024 NEGATIVE  NEGATIVE Final   RBC / HPF 03/07/2024 0-5  0 - 5 RBC/hpf Final   WBC, UA 03/07/2024 0  0 - 5 WBC/hpf Final   Bacteria, UA 03/07/2024 NONE SEEN  NONE SEEN Final   Squamous Epithelial / HPF 03/07/2024 0  0 - 5 /HPF Final   Performed at [facility], [address] [place], [place] [address]   CRP 03/07/2024 0.5  <1.0 mg/dL Final   Performed at [facility], [address] [place], [place] [address]   Sed Rate 03/07/2024 8  0 - 20 mm/hr Final   Performed at [facility], [address] [place], [place] [address]   Hgb A1c MFr Bld 03/07/2024 6.5 (H)  4.8 - 5.6 % Final   Comment: (NOTE) Pre diabetes:          5.7%-6.4%  Diabetes:              >6.4%  Glycemic control for   <7.0% adults with diabetes    Mean Plasma Glucose 03/07/2024 139.85  mg/dL Final   Performed at [facility], [address], [place], [place] [address]   MRSA, PCR 03/07/2024 NEGATIVE  NEGATIVE Final   Staphylococcus aureus 03/07/2024 POSITIVE (A)  NEGATIVE Final   Comment: (NOTE) The Xpert SA Assay (FDA approved for NASAL specimens in patients 35 years of age and older), is one component of a comprehensive surveillance program. It  is not intended to diagnose infection nor to guide or monitor treatment. Performed at [facility], [address] [place], [place] [address]    ABO/RH(D) 03/07/2024 A POS   Final   Antibody Screen 03/07/2024 NEG   Final   Sample Expiration 03/07/2024 03/21/2024,2359   Final   Extend sample reason 03/07/2024    Final                   Value:NO TRANSFUSIONS OR PREGNANCY IN THE PAST 3 MONTHS Performed at [facility], [address] [place], [place] [address]     ECG: Date: 01/09/2024  Time ECG obtained: 0958 AM Rate: 74 bpm Rhythm: atrial fibrillation Axis (leads I and aVF): normal Intervals: QRS 100 ms. QTc 430 ms. ST segment and T wave changes: No evidence of acute T wave abnormalities or significant ST segment elevation or depression.  Evidence of a possible, age undetermined, prior infarct:  No Comparison: Previous tracing obtained on 12/07/2023 showed normal sinus rhythm with sinus arrhythmia at a rate of 80 bpm. NOTE: Tracing obtained at [facility]; unable for review. Above based on cardiologist's interpretation.    IMAGING / PROCEDURES: MYOCARDIAL PERFUSION IMAGING STUDY (LEXISCAN) performed on 01/17/2024 Normal left ventricular systolic function with a normal LVEF of 53% Normal myocardial thickening and wall motion Left ventricular cavity size normal SPECT images demonstrate homogenous tracer distribution throughout the myocardium TID ratio = 1.08 No evidence of stress-induced myocardial ischemia or arrhythmia Normal low risk study  CT CARDIAC SCORING performed on 12/01/2023 Coronary calcium  score of 3737. This was 99th percentile for age and sex matched control. CAC >300 in LAD, LCx, RCA. CAC-DRS A3/N3. Recommend aspirin and statin if no contraindication Recommend cardiology consultation. Continue heart healthy lifestyle and risk factor modification.  TRANSTHORACIC ECHOCARDIOGRAM performed on 12/02/2021 Normal left ventricular systolic  function with an EF  of >55% Moderately enlarged left atrium Trivial MR and TR Mild PR  No AR Normal gradients; no valvular stenosis No pericardial effusion  Impression and Plan:  Cory Perez has been referred for pre-anesthesia review and clearance prior to him undergoing the planned anesthetic and procedural courses. Available labs, pertinent testing, and imaging results were personally reviewed by me in preparation for upcoming operative/procedural course. [facility] medical record has been updated following extensive record review and patient interview with PAT staff.   ATTENTION --> PENDING CLEARANCE AT THIS TIME -- NOTE/CONTENTS NOT FINAL UNTIL SIGNED This patient has been appropriately cleared by cardiology with an overall *** risk of experiencing significant perioperative cardiovascular complications. Based on clinical review performed today (03/08/24), barring any significant acute changes in the patient's overall condition, it is anticipated that he will be able to proceed with the planned surgical intervention. Any acute changes in clinical condition may necessitate his procedure being postponed and/or cancelled. Patient will meet with anesthesia team (MD and/or CRNA) on the day of his procedure for preoperative evaluation/assessment. Questions regarding anesthetic course will be fielded at that time.   Pre-surgical instructions were reviewed with the patient during his PAT appointment, and questions were fielded to satisfaction by PAT clinical staff. He has been instructed on which medications that he will need to hold prior to surgery, as well as the ones that have been deemed safe/appropriate to take on the day of his procedure. As part of the general education provided by PAT, patient made aware both verbally and in writing, that he would need to abstain from the use of any illegal substances during his perioperative course. He was advised that failure to follow the provided  instructions could necessitate case cancellation or result in serious perioperative complications up to and including death. Patient encouraged to contact PAT and/or his surgeon's office to discuss any questions or concerns that may arise prior to surgery; verbalized understanding.   Renate Caroline, MSN, APRN, FNP-C, CEN [facility]  Perioperative Services Nurse Practitioner Phone: [phone] Fax: [phone] 03/08/24 1:05 PM  NOTE: This note has been prepared using Dragon dictation software. Despite my best ability to proofread, there is always the potential that unintentional transcriptional errors may still occur from this process.

## 2024-03-12 ENCOUNTER — Encounter: Payer: Self-pay | Admitting: Orthopedic Surgery

## 2024-03-12 MED ORDER — TRANEXAMIC ACID-NACL 1000-0.7 MG/100ML-% IV SOLN
1000.0000 mg | INTRAVENOUS | Status: AC
  Administered 2024-03-13: 1000 mg via INTRAVENOUS

## 2024-03-12 MED ORDER — CHLORHEXIDINE GLUCONATE 0.12 % MT SOLN
15.0000 mL | Freq: Once | OROMUCOSAL | Status: AC
  Administered 2024-03-13: 15 mL via OROMUCOSAL

## 2024-03-12 MED ORDER — ORAL CARE MOUTH RINSE: 15.0000 mL | Freq: Once | OROMUCOSAL | Status: AC

## 2024-03-12 MED ORDER — CHLORHEXIDINE GLUCONATE 4 % EX SOLN
60.0000 mL | Freq: Once | CUTANEOUS | Status: AC
  Administered 2024-03-13: 4 via TOPICAL

## 2024-03-12 MED ORDER — CELECOXIB 200 MG PO CAPS
400.0000 mg | ORAL_CAPSULE | Freq: Once | ORAL | Status: AC
  Administered 2024-03-13: 400 mg via ORAL

## 2024-03-12 MED ORDER — SODIUM CHLORIDE 0.9 % IV SOLN: INTRAVENOUS | Status: DC

## 2024-03-12 MED ORDER — GABAPENTIN 300 MG PO CAPS
300.0000 mg | ORAL_CAPSULE | Freq: Once | ORAL | Status: AC
Start: 2024-03-12 — End: 2024-03-13
  Administered 2024-03-13: 300 mg via ORAL

## 2024-03-12 MED ORDER — DEXAMETHASONE SODIUM PHOSPHATE 10 MG/ML IJ SOLN
8.0000 mg | Freq: Once | INTRAMUSCULAR | Status: AC
  Administered 2024-03-13: 8 mg via INTRAVENOUS

## 2024-03-12 MED ORDER — CEFAZOLIN SODIUM-DEXTROSE 3-4 GM/150ML-% IV SOLN
3.0000 g | INTRAVENOUS | Status: AC
  Administered 2024-03-13: 3 g via INTRAVENOUS
  Filled 2024-03-12: qty 150

## 2024-03-13 ENCOUNTER — Observation Stay
Admission: RE | Admit: 2024-03-13 | Discharge: 2024-03-14 | Disposition: A | Discharge status: Home Health | Source: Ambulatory Visit | Attending: Orthopedic Surgery | Admitting: Orthopedic Surgery

## 2024-03-13 ENCOUNTER — Encounter: Payer: Self-pay | Admitting: Orthopedic Surgery

## 2024-03-13 ENCOUNTER — Ambulatory Visit: Payer: Self-pay | Admitting: Urgent Care

## 2024-03-13 ENCOUNTER — Encounter
Admission: RE | Disposition: A | Discharge status: Home Health | Payer: Self-pay | Source: Ambulatory Visit | Attending: Orthopedic Surgery

## 2024-03-13 ENCOUNTER — Other Ambulatory Visit: Payer: Self-pay

## 2024-03-13 ENCOUNTER — Observation Stay

## 2024-03-13 DIAGNOSIS — E1169 Type 2 diabetes mellitus with other specified complication: Secondary | ICD-10-CM | POA: Insufficient documentation

## 2024-03-13 DIAGNOSIS — I2510 Atherosclerotic heart disease of native coronary artery without angina pectoris: Secondary | ICD-10-CM | POA: Diagnosis not present

## 2024-03-13 DIAGNOSIS — Z96652 Presence of left artificial knee joint: Secondary | ICD-10-CM | POA: Diagnosis not present

## 2024-03-13 DIAGNOSIS — Z7901 Long term (current) use of anticoagulants: Secondary | ICD-10-CM | POA: Insufficient documentation

## 2024-03-13 DIAGNOSIS — I10 Essential (primary) hypertension: Secondary | ICD-10-CM | POA: Diagnosis not present

## 2024-03-13 DIAGNOSIS — Z96641 Presence of right artificial hip joint: Secondary | ICD-10-CM

## 2024-03-13 DIAGNOSIS — Z794 Long term (current) use of insulin: Secondary | ICD-10-CM | POA: Insufficient documentation

## 2024-03-13 DIAGNOSIS — I4891 Unspecified atrial fibrillation: Secondary | ICD-10-CM | POA: Insufficient documentation

## 2024-03-13 DIAGNOSIS — L409 Psoriasis, unspecified: Secondary | ICD-10-CM

## 2024-03-13 DIAGNOSIS — Z01818 Encounter for other preprocedural examination: Principal | ICD-10-CM

## 2024-03-13 DIAGNOSIS — E2609 Other primary hyperaldosteronism: Secondary | ICD-10-CM

## 2024-03-13 DIAGNOSIS — Z79899 Other long term (current) drug therapy: Secondary | ICD-10-CM | POA: Diagnosis not present

## 2024-03-13 DIAGNOSIS — E119 Type 2 diabetes mellitus without complications: Secondary | ICD-10-CM | POA: Diagnosis not present

## 2024-03-13 DIAGNOSIS — E1142 Type 2 diabetes mellitus with diabetic polyneuropathy: Secondary | ICD-10-CM

## 2024-03-13 DIAGNOSIS — M1611 Unilateral primary osteoarthritis, right hip: Principal | ICD-10-CM | POA: Insufficient documentation

## 2024-03-13 HISTORY — DX: Long term (current) use of anticoagulants: Z79.01

## 2024-03-13 HISTORY — DX: Type 2 diabetes mellitus with diabetic polyneuropathy: E11.42

## 2024-03-13 HISTORY — DX: Atherosclerosis of aorta: I70.0

## 2024-03-13 HISTORY — DX: Deficiency of other specified B group vitamins: E53.8

## 2024-03-13 HISTORY — DX: Obstructive sleep apnea (adult) (pediatric): G47.33

## 2024-03-13 HISTORY — PX: TOTAL HIP ARTHROPLASTY: SHX124

## 2024-03-13 HISTORY — DX: Hyperaldosteronism, unspecified: E26.9

## 2024-03-13 HISTORY — DX: Thrombocytopenia, unspecified: D69.6

## 2024-03-13 HISTORY — DX: Vitamin D deficiency, unspecified: E55.9

## 2024-03-13 HISTORY — DX: Long term (current) use of immunosuppressive biologic: Z79.620

## 2024-03-13 HISTORY — DX: Atherosclerotic heart disease of native coronary artery without angina pectoris: I25.10

## 2024-03-13 LAB — GLUCOSE, CAPILLARY
Glucose-Capillary: 148 mg/dL — ABNORMAL HIGH (ref 70–99)
Glucose-Capillary: 199 mg/dL — ABNORMAL HIGH (ref 70–99)
Glucose-Capillary: 232 mg/dL — ABNORMAL HIGH (ref 70–99)
Glucose-Capillary: 235 mg/dL — ABNORMAL HIGH (ref 70–99)
Glucose-Capillary: 175 mg/dL — ABNORMAL HIGH (ref 70–99)

## 2024-03-13 SURGERY — ARTHROPLASTY, HIP, TOTAL,POSTERIOR APPROACH
Anesthesia: Spinal | Site: Hip | Laterality: Right

## 2024-03-13 MED ORDER — BUPIVACAINE HCL (PF) 0.5 % IJ SOLN
INTRAMUSCULAR | Status: DC | PRN
  Administered 2024-03-13: 3 mL via INTRATHECAL

## 2024-03-13 MED ORDER — FENTANYL CITRATE (PF) 100 MCG/2ML IJ SOLN: 25.0000 ug | INTRAMUSCULAR | Status: DC | PRN

## 2024-03-13 MED ORDER — CHLORHEXIDINE GLUCONATE 4 % EX SOLN: 1.0000 | CUTANEOUS | 1 refills | Status: AC

## 2024-03-13 MED ORDER — OXYCODONE HCL 5 MG PO TABS
5.0000 mg | ORAL_TABLET | Freq: Once | ORAL | Status: AC | PRN
  Administered 2024-03-13: 5 mg via ORAL

## 2024-03-13 MED ORDER — CELECOXIB 200 MG PO CAPS
ORAL_CAPSULE | ORAL | Status: AC
  Filled 2024-03-13: qty 2

## 2024-03-13 MED ORDER — GABAPENTIN 300 MG PO CAPS
ORAL_CAPSULE | ORAL | Status: AC
  Filled 2024-03-13: qty 1

## 2024-03-13 MED ORDER — TRAMADOL HCL 50 MG PO TABS
50.0000 mg | ORAL_TABLET | ORAL | Status: DC | PRN
Start: 2024-03-13 — End: 2024-03-14

## 2024-03-13 MED ORDER — METFORMIN HCL ER 500 MG PO TB24
1000.0000 mg | ORAL_TABLET | Freq: Every day | ORAL | Status: DC
  Administered 2024-03-14: 1000 mg via ORAL

## 2024-03-13 MED ORDER — ORAL CARE MOUTH RINSE: 15.0000 mL | OROMUCOSAL | Status: DC | PRN

## 2024-03-13 MED ORDER — ACETAMINOPHEN 10 MG/ML IV SOLN
1000.0000 mg | Freq: Four times a day (QID) | INTRAVENOUS | Status: AC
Start: 2024-03-13 — End: 2024-03-14
  Administered 2024-03-13 – 2024-03-14 (×3): 1000 mg via INTRAVENOUS
  Filled 2024-03-13 (×4): qty 100

## 2024-03-13 MED ORDER — LISINOPRIL 5 MG PO TABS: 2.5000 mg | ORAL_TABLET | Freq: Every day | ORAL | Status: DC

## 2024-03-13 MED ORDER — MIDAZOLAM HCL 2 MG/2ML IJ SOLN
INTRAMUSCULAR | Status: AC
  Filled 2024-03-13: qty 2

## 2024-03-13 MED ORDER — SODIUM CHLORIDE 0.9 % IR SOLN
Status: DC | PRN
  Administered 2024-03-13: 3000 mL

## 2024-03-13 MED ORDER — ONDANSETRON HCL 4 MG/2ML IJ SOLN
INTRAMUSCULAR | Status: DC | PRN
  Administered 2024-03-13: 4 mg via INTRAVENOUS

## 2024-03-13 MED ORDER — PHENYLEPHRINE HCL-NACL 20-0.9 MG/250ML-% IV SOLN
INTRAVENOUS | Status: AC
  Filled 2024-03-13: qty 250

## 2024-03-13 MED ORDER — PROPOFOL 1000 MG/100ML IV EMUL
INTRAVENOUS | Status: AC
  Filled 2024-03-13: qty 100

## 2024-03-13 MED ORDER — SERTRALINE HCL 50 MG PO TABS
200.0000 mg | ORAL_TABLET | Freq: Every day | ORAL | Status: DC
  Administered 2024-03-13: 200 mg via ORAL
  Filled 2024-03-13: qty 4

## 2024-03-13 MED ORDER — INSULIN ASPART 100 UNIT/ML IJ SOLN: 0.0000 [IU] | Freq: Every day | INTRAMUSCULAR | Status: DC

## 2024-03-13 MED ORDER — FLEET ENEMA RE ENEM: 1.0000 | ENEMA | Freq: Once | RECTAL | Status: DC | PRN

## 2024-03-13 MED ORDER — ALUM & MAG HYDROXIDE-SIMETH 200-200-20 MG/5ML PO SUSP: 30.0000 mL | ORAL | Status: DC | PRN

## 2024-03-13 MED ORDER — SODIUM CHLORIDE 0.9 % IV SOLN: INTRAVENOUS | Status: DC

## 2024-03-13 MED ORDER — TRANEXAMIC ACID-NACL 1000-0.7 MG/100ML-% IV SOLN
INTRAVENOUS | Status: AC
  Filled 2024-03-13: qty 100

## 2024-03-13 MED ORDER — OXYCODONE HCL 5 MG/5ML PO SOLN: 5.0000 mg | Freq: Once | ORAL | Status: AC | PRN

## 2024-03-13 MED ORDER — SURGIPHOR WOUND IRRIGATION SYSTEM - OPTIME: TOPICAL | Status: DC | PRN

## 2024-03-13 MED ORDER — OXYCODONE HCL 5 MG PO TABS
ORAL_TABLET | ORAL | Status: AC
Start: 2024-03-13 — End: ?
  Filled 2024-03-13: qty 1

## 2024-03-13 MED ORDER — PHENYLEPHRINE HCL-NACL 20-0.9 MG/250ML-% IV SOLN
INTRAVENOUS | Status: DC | PRN
Start: 2024-03-13 — End: 2024-03-13
  Administered 2024-03-13: 20 ug/min via INTRAVENOUS

## 2024-03-13 MED ORDER — INSULIN ASPART 100 UNIT/ML IJ SOLN
0.0000 [IU] | Freq: Three times a day (TID) | INTRAMUSCULAR | Status: DC
  Administered 2024-03-13: 5 [IU] via SUBCUTANEOUS
  Administered 2024-03-14: 3 [IU] via SUBCUTANEOUS
  Filled 2024-03-13 (×2): qty 1

## 2024-03-13 MED ORDER — HEMOSTATIC AGENTS (NO CHARGE) OPTIME
TOPICAL | Status: DC | PRN
  Administered 2024-03-13: 1 via TOPICAL

## 2024-03-13 MED ORDER — METOPROLOL SUCCINATE ER 25 MG PO TB24
50.0000 mg | ORAL_TABLET | Freq: Every day | ORAL | Status: DC
  Filled 2024-03-13 (×2): qty 2

## 2024-03-13 MED ORDER — FERROUS SULFATE 325 (65 FE) MG PO TABS
325.0000 mg | ORAL_TABLET | Freq: Two times a day (BID) | ORAL | Status: DC
  Administered 2024-03-13 – 2024-03-14 (×2): 325 mg via ORAL
  Filled 2024-03-13 (×2): qty 1

## 2024-03-13 MED ORDER — PHENYLEPHRINE 80 MCG/ML (10ML) SYRINGE FOR IV PUSH (FOR BLOOD PRESSURE SUPPORT)
PREFILLED_SYRINGE | INTRAVENOUS | Status: DC | PRN
  Administered 2024-03-13: 160 ug via INTRAVENOUS
  Administered 2024-03-13: 80 ug via INTRAVENOUS
  Administered 2024-03-13: 160 ug via INTRAVENOUS

## 2024-03-13 MED ORDER — ROSUVASTATIN CALCIUM 10 MG PO TABS
40.0000 mg | ORAL_TABLET | Freq: Every day | ORAL | Status: DC
  Administered 2024-03-14: 40 mg via ORAL
  Filled 2024-03-13: qty 4

## 2024-03-13 MED ORDER — PANTOPRAZOLE SODIUM 40 MG PO TBEC
40.0000 mg | DELAYED_RELEASE_TABLET | Freq: Two times a day (BID) | ORAL | Status: DC
  Administered 2024-03-13 – 2024-03-14 (×3): 40 mg via ORAL
  Filled 2024-03-13 (×3): qty 1

## 2024-03-13 MED ORDER — EMPAGLIFLOZIN-METFORMIN HCL ER 12.5-1000 MG PO TB24: 2.0000 | ORAL_TABLET | Freq: Every morning | ORAL | Status: DC

## 2024-03-13 MED ORDER — ACETAMINOPHEN 10 MG/ML IV SOLN: 1000.0000 mg | Freq: Once | INTRAVENOUS | Status: DC | PRN

## 2024-03-13 MED ORDER — OXYCODONE HCL 5 MG PO TABS
ORAL_TABLET | ORAL | Status: AC
  Filled 2024-03-13: qty 2

## 2024-03-13 MED ORDER — FENTANYL CITRATE (PF) 100 MCG/2ML IJ SOLN
INTRAMUSCULAR | Status: DC | PRN
  Administered 2024-03-13: 50 ug via INTRAVENOUS

## 2024-03-13 MED ORDER — OXYCODONE HCL 5 MG PO TABS: 5.0000 mg | ORAL_TABLET | ORAL | Status: DC | PRN

## 2024-03-13 MED ORDER — ONDANSETRON HCL 4 MG/2ML IJ SOLN: 4.0000 mg | Freq: Four times a day (QID) | INTRAMUSCULAR | Status: DC | PRN

## 2024-03-13 MED ORDER — FLUTICASONE PROPIONATE 50 MCG/ACT NA SUSP
2.0000 | Freq: Every day | NASAL | Status: DC
  Administered 2024-03-13: 2 via NASAL
  Filled 2024-03-13: qty 16

## 2024-03-13 MED ORDER — METOCLOPRAMIDE HCL 10 MG PO TABS
10.0000 mg | ORAL_TABLET | Freq: Three times a day (TID) | ORAL | Status: DC
  Administered 2024-03-13 – 2024-03-14 (×3): 10 mg via ORAL
  Filled 2024-03-13 (×3): qty 1

## 2024-03-13 MED ORDER — ONDANSETRON HCL 4 MG/2ML IJ SOLN
INTRAMUSCULAR | Status: AC
  Filled 2024-03-13: qty 2

## 2024-03-13 MED ORDER — CEFAZOLIN SODIUM-DEXTROSE 2-4 GM/100ML-% IV SOLN
2.0000 g | Freq: Four times a day (QID) | INTRAVENOUS | Status: AC
  Administered 2024-03-13 (×2): 2 g via INTRAVENOUS
  Filled 2024-03-13 (×4): qty 100

## 2024-03-13 MED ORDER — LIDOCAINE HCL (CARDIAC) PF 100 MG/5ML IV SOSY
PREFILLED_SYRINGE | INTRAVENOUS | Status: DC | PRN
  Administered 2024-03-13: 40 mg via INTRAVENOUS

## 2024-03-13 MED ORDER — ONDANSETRON HCL 4 MG/2ML IJ SOLN: 4.0000 mg | Freq: Once | INTRAMUSCULAR | Status: DC | PRN

## 2024-03-13 MED ORDER — SENNOSIDES-DOCUSATE SODIUM 8.6-50 MG PO TABS
1.0000 | ORAL_TABLET | Freq: Two times a day (BID) | ORAL | Status: DC
  Administered 2024-03-13 – 2024-03-14 (×3): 1 via ORAL
  Filled 2024-03-13 (×3): qty 1

## 2024-03-13 MED ORDER — EMPAGLIFLOZIN 25 MG PO TABS
25.0000 mg | ORAL_TABLET | Freq: Every day | ORAL | Status: DC
  Administered 2024-03-14: 25 mg via ORAL
  Filled 2024-03-13: qty 1

## 2024-03-13 MED ORDER — ONDANSETRON HCL 4 MG PO TABS: 4.0000 mg | ORAL_TABLET | Freq: Four times a day (QID) | ORAL | Status: DC | PRN

## 2024-03-13 MED ORDER — BISACODYL 10 MG RE SUPP: 10.0000 mg | Freq: Every day | RECTAL | Status: DC | PRN

## 2024-03-13 MED ORDER — LACTATED RINGERS IV SOLN: INTRAVENOUS | Status: DC

## 2024-03-13 MED ORDER — TRANEXAMIC ACID-NACL 1000-0.7 MG/100ML-% IV SOLN
1000.0000 mg | Freq: Once | INTRAVENOUS | Status: AC
  Administered 2024-03-13: 1000 mg via INTRAVENOUS

## 2024-03-13 MED ORDER — HYDROMORPHONE HCL 1 MG/ML IJ SOLN: 0.5000 mg | INTRAMUSCULAR | Status: DC | PRN

## 2024-03-13 MED ORDER — DEXAMETHASONE SODIUM PHOSPHATE 10 MG/ML IJ SOLN
INTRAMUSCULAR | Status: AC
  Filled 2024-03-13: qty 1

## 2024-03-13 MED ORDER — BACLOFEN 10 MG PO TABS
10.0000 mg | ORAL_TABLET | Freq: Three times a day (TID) | ORAL | Status: DC | PRN
  Administered 2024-03-13: 10 mg via ORAL
  Filled 2024-03-13: qty 1

## 2024-03-13 MED ORDER — OXYCODONE HCL 5 MG PO TABS
10.0000 mg | ORAL_TABLET | ORAL | Status: DC | PRN
  Administered 2024-03-13 – 2024-03-14 (×2): 10 mg via ORAL
  Filled 2024-03-13: qty 2

## 2024-03-13 MED ORDER — MIDAZOLAM HCL 2 MG/2ML IJ SOLN
INTRAMUSCULAR | Status: DC | PRN
  Administered 2024-03-13: 2 mg via INTRAVENOUS

## 2024-03-13 MED ORDER — ACETAMINOPHEN 10 MG/ML IV SOLN
INTRAVENOUS | Status: DC | PRN
  Administered 2024-03-13: 1000 mg via INTRAVENOUS

## 2024-03-13 MED ORDER — MUPIROCIN 2 % EX OINT: 1.0000 | TOPICAL_OINTMENT | Freq: Two times a day (BID) | CUTANEOUS | 0 refills | Status: AC

## 2024-03-13 MED ORDER — PROPOFOL 500 MG/50ML IV EMUL
INTRAVENOUS | Status: DC | PRN
Start: 2024-03-13 — End: 2024-03-13
  Administered 2024-03-13: 80 ug/kg/min via INTRAVENOUS

## 2024-03-13 MED ORDER — PIOGLITAZONE HCL 30 MG PO TABS
30.0000 mg | ORAL_TABLET | Freq: Every day | ORAL | Status: DC
  Administered 2024-03-14: 30 mg via ORAL
  Filled 2024-03-13: qty 1

## 2024-03-13 MED ORDER — CHLORHEXIDINE GLUCONATE 0.12 % MT SOLN
OROMUCOSAL | Status: AC
  Filled 2024-03-13: qty 15

## 2024-03-13 MED ORDER — 0.9 % SODIUM CHLORIDE (POUR BTL) OPTIME
TOPICAL | Status: DC | PRN
  Administered 2024-03-13: 500 mL

## 2024-03-13 MED ORDER — ACETAMINOPHEN 10 MG/ML IV SOLN
INTRAVENOUS | Status: AC
  Filled 2024-03-13: qty 100

## 2024-03-13 MED ORDER — MAGNESIUM HYDROXIDE 400 MG/5ML PO SUSP
30.0000 mL | Freq: Every day | ORAL | Status: DC
  Administered 2024-03-13 – 2024-03-14 (×2): 30 mL via ORAL
  Filled 2024-03-13 (×2): qty 30

## 2024-03-13 MED ORDER — MENTHOL 3 MG MT LOZG: 1.0000 | LOZENGE | OROMUCOSAL | Status: DC | PRN

## 2024-03-13 MED ORDER — EPHEDRINE SULFATE-NACL 50-0.9 MG/10ML-% IV SOSY
PREFILLED_SYRINGE | INTRAVENOUS | Status: DC | PRN
  Administered 2024-03-13: 5 mg via INTRAVENOUS

## 2024-03-13 MED ORDER — DIPHENHYDRAMINE HCL 12.5 MG/5ML PO ELIX: 12.5000 mg | ORAL_SOLUTION | ORAL | Status: DC | PRN

## 2024-03-13 MED ORDER — PHENOL 1.4 % MT LIQD: 1.0000 | OROMUCOSAL | Status: DC | PRN

## 2024-03-13 MED ORDER — CELECOXIB 200 MG PO CAPS
200.0000 mg | ORAL_CAPSULE | Freq: Two times a day (BID) | ORAL | Status: DC
  Administered 2024-03-13 – 2024-03-14 (×3): 200 mg via ORAL
  Filled 2024-03-13 (×4): qty 1

## 2024-03-13 MED ORDER — APIXABAN 2.5 MG PO TABS
5.0000 mg | ORAL_TABLET | Freq: Two times a day (BID) | ORAL | Status: DC
  Administered 2024-03-14: 5 mg via ORAL
  Filled 2024-03-13: qty 2

## 2024-03-13 MED ORDER — PHENYLEPHRINE HCL (PRESSORS) 10 MG/ML IV SOLN
INTRAVENOUS | Status: AC
  Filled 2024-03-13: qty 1

## 2024-03-13 MED ORDER — PROPOFOL 10 MG/ML IV BOLUS
INTRAVENOUS | Status: AC
  Filled 2024-03-13: qty 20

## 2024-03-13 MED ORDER — FENTANYL CITRATE (PF) 100 MCG/2ML IJ SOLN
INTRAMUSCULAR | Status: AC
  Filled 2024-03-13: qty 2

## 2024-03-13 MED ORDER — ACETAMINOPHEN 325 MG PO TABS: 325.0000 mg | ORAL_TABLET | Freq: Four times a day (QID) | ORAL | Status: DC | PRN

## 2024-03-13 SURGICAL SUPPLY — 46 items
BLADE CLIPPER SURG (BLADE) IMPLANT
BLADE SAW 90X25X1.19 OSCILLAT (BLADE) ×1 IMPLANT
BRUSH SCRUB EZ PLAIN DRY (MISCELLANEOUS) ×1 IMPLANT
CUP PINNACLE 100 SERIES 58MM (Hips) IMPLANT
DRAPE INCISE IOBAN 66X60 STRL (DRAPES) ×1 IMPLANT
DRAPE SHEET LG 3/4 BI-LAMINATE (DRAPES) ×1 IMPLANT
DRSG AQUACEL AG ADV 3.5X14 (GAUZE/BANDAGES/DRESSINGS) ×1 IMPLANT
DRSG MEPILEX SACRM 8.7X9.8 (GAUZE/BANDAGES/DRESSINGS) ×1 IMPLANT
DRSG TEGADERM 4X4.75 (GAUZE/BANDAGES/DRESSINGS) ×1 IMPLANT
DURAPREP 26ML APPLICATOR (WOUND CARE) ×2 IMPLANT
ELECT CAUTERY BLADE 6.4 (BLADE) ×1 IMPLANT
ELECTRODE REM PT RTRN 9FT ADLT (ELECTROSURGICAL) ×1 IMPLANT
EVACUATOR 1/8 PVC DRAIN (DRAIN) ×1 IMPLANT
GAUZE XEROFORM 1X8 LF (GAUZE/BANDAGES/DRESSINGS) ×1 IMPLANT
GLOVE BIO SURGEON STRL SZ7.5 (GLOVE) ×6 IMPLANT
GLOVE BIOGEL PI IND STRL 8 (GLOVE) ×2 IMPLANT
GOWN STRL REUS W/ TWL LRG LVL3 (GOWN DISPOSABLE) ×2 IMPLANT
GOWN STRL REUS W/ TWL XL LVL3 (GOWN DISPOSABLE) ×1 IMPLANT
GOWN TOGA ZIPPER T7+ PEEL AWAY (MISCELLANEOUS) ×1 IMPLANT
HANDLE YANKAUER SUCT OPEN TIP (MISCELLANEOUS) ×1 IMPLANT
HEAD M SROM 36MM PLUS 1.5 (Hips) IMPLANT
HEMOSTAT HEMOBLAST BELLOWS (HEMOSTASIS) IMPLANT
HOLDER FOLEY CATH W/STRAP (MISCELLANEOUS) ×1 IMPLANT
HOOD PEEL AWAY T7 (MISCELLANEOUS) ×1 IMPLANT
KIT PEG BOARD PINK (KITS) ×1 IMPLANT
KIT TURNOVER KIT A (KITS) ×1 IMPLANT
LINER MARATHON 10D 36M (Hips) IMPLANT
MANIFOLD NEPTUNE II (INSTRUMENTS) ×2 IMPLANT
NS IRRIG 500ML POUR BTL (IV SOLUTION) ×1 IMPLANT
PACK HIP PROSTHESIS (MISCELLANEOUS) ×1 IMPLANT
PENCIL SMOKE EVACUATOR COATED (MISCELLANEOUS) ×1 IMPLANT
SOL .9 NS 3000ML IRR UROMATIC (IV SOLUTION) ×1 IMPLANT
SOLUTION IRRIG SURGIPHOR (IV SOLUTION) ×1 IMPLANT
SPONGE DRAIN TRACH 4X4 STRL 2S (GAUZE/BANDAGES/DRESSINGS) ×1 IMPLANT
STAPLER SKIN PROX 35W (STAPLE) ×1 IMPLANT
STEM FEM ACTIS STD SZ7 (Nail) IMPLANT
SUT ETHIBOND #5 BRAIDED 30INL (SUTURE) ×1 IMPLANT
SUT VIC AB 0 CT1 36 (SUTURE) ×2 IMPLANT
SUT VIC AB 1 CT1 36 (SUTURE) ×2 IMPLANT
SUT VIC AB 2-0 CT1 TAPERPNT 27 (SUTURE) ×1 IMPLANT
TAPE CLOTH 3X10 WHT NS LF (GAUZE/BANDAGES/DRESSINGS) ×1 IMPLANT
TIP FAN IRRIG PULSAVAC PLUS (DISPOSABLE) ×1 IMPLANT
TOWEL OR 17X26 4PK STRL BLUE (TOWEL DISPOSABLE) IMPLANT
TRAP FLUID SMOKE EVACUATOR (MISCELLANEOUS) ×1 IMPLANT
TRAY FOLEY MTR SLVR 16FR STAT (SET/KITS/TRAYS/PACK) ×1 IMPLANT
WATER STERILE IRR 1000ML POUR (IV SOLUTION) ×1 IMPLANT

## 2024-03-13 NOTE — Anesthesia Procedure Notes (Signed)
 Spinal  Patient location during procedure: OR Start time: 03/13/2024 7:27 AM End time: 03/13/2024 7:27 AM Reason for block: surgical anesthesia Staffing Performed: resident/CRNA  Resident/CRNA: Bill Budd, CRNA Performed by: Bill Budd, CRNA Authorized by: Bill Budd, CRNA   Preanesthetic Checklist Completed: patient identified, IV checked, site marked, risks and benefits discussed, surgical consent, monitors and equipment checked, pre-op evaluation and timeout performed Spinal Block Patient position: sitting Prep: Betadine Patient monitoring: heart rate, continuous pulse ox, blood pressure and cardiac monitor Approach: midline Location: L4-5 Injection technique: single-shot Needle Needle type: Whitacre, Introducer and Quincke  Needle gauge: 22 G Needle length: 10 cm Assessment Sensory level: T6 Events: CSF return Additional Notes Negative paresthesia. Negative blood return. Positive free-flowing CSF. Expiration date of kit checked and confirmed. Patient tolerated procedure well, without complications.

## 2024-03-13 NOTE — Evaluation (Signed)
 Physical Therapy Evaluation Patient Details Name: Cory Perez MRN: [id number] DOB: 04-26-1966 Today's Date: 03/13/2024  History of Present Illness  58yoM presenting for Rt elective posterior THA. PMH: Left partial knee arthroplaty, L3-S1 decompression, AF s/p ablation, angina, HTN, DM2, OSA on CPAP.  Clinical Impression  Pt in bed on entry, ready for PT evaluation, pain at goal, wife at bedside. Pt able to demonstrate bed mobility, transfers, and AMB without any physical assistance, however maintained elevated seated surfaces due to mismatch between pt height and hip precautions. Pt easily covers 164ft AMB, anticipate successful progression next day. Will also gie HEP education next day. Took time to obtain specialty size recliner for pt room at end of session. Pt has a walker at home but will need a tall/wide [facility] for DC to maintain hip precaution while on commode at house.       If plan is discharge home, recommend the following: Assist for transportation;Assistance with cooking/housework;Help with stairs or ramp for entrance;A little help with bathing/dressing/bathroom   Can travel by private vehicle        Equipment Recommendations Other (comment) (large size BSC (wide/tall))  Recommendations for Other Services       Functional Status Assessment Patient has had a recent decline in their functional status and demonstrates the ability to make significant improvements in function in a reasonable and predictable amount of time.     Precautions / Restrictions Precautions Precautions: Fall Recall of Precautions/Restrictions: Intact Restrictions Weight Bearing Restrictions Per Provider Order: Yes RLE Weight Bearing Per Provider Order: Weight bearing as tolerated      Mobility  Bed Mobility Overal bed mobility: Needs Assistance Bed Mobility: Supine to Sit, Sit to Supine     Supine to sit: Supervision Sit to supine: Supervision   General bed mobility comments: moderate to heavy  effort, but no assist needs, does not cross midline    Transfers Overall transfer level: Needs assistance Equipment used:  (bari/tall RW) Transfers: Sit to/from Stand Sit to Stand: Contact guard assist, Supervision, From elevated surface           General transfer comment: unable to perform from standard heigth given his posterior hip precautions.    Ambulation/Gait Ambulation/Gait assistance: Contact guard assist, Supervision Gait Distance (Feet): 100 Feet Assistive device:  (bari/tall RW) Gait Pattern/deviations: Step-through pattern       General Gait Details: minimal antalgia, moving well in general, no LOB, tall posture, cues for safe turn arounds  Stairs            Wheelchair Mobility     Tilt Bed    Modified Rankin (Stroke Patients Only)       Balance                                             Pertinent Vitals/Pain Pain Assessment Pain Assessment: 0-10 Pain Score: 3  Pain Location: Rt lateral hip sting Pain Descriptors / Indicators: Operative site guarding Pain Intervention(s): Limited activity within patient's tolerance, Monitored during session, Premedicated before session    Home Living Family/patient expects to be discharged to:: Private residence Living Arrangements: Spouse/significant other Available Help at Discharge: Family Type of Home: House Home Access: Stairs to enter Entrance Stairs-Rails: Right;Left;Can reach both Entrance Stairs-Number of Steps: 2   Home Layout: One level Home Equipment: Cane - single point (tall walker from spine surgery 2023)  Prior Function                       Extremity/Trunk Assessment                Communication        Cognition Arousal: Alert Behavior During Therapy: WFL for tasks assessed/performed   PT - Cognitive impairments: No apparent impairments                                 Cueing       General Comments      Exercises      Assessment/Plan    PT Assessment Patient needs continued PT services  PT Problem List Decreased strength;Decreased activity tolerance;Decreased balance;Decreased mobility;Decreased coordination       PT Treatment Interventions DME instruction;Gait training;Stair training;Therapeutic activities;Therapeutic exercise;Balance training    PT Goals (Current goals can be found in the Care Plan section)  Acute Rehab PT Goals Patient Stated Goal: improve AMB distance before discharge PT Goal Formulation: With patient Time For Goal Achievement: 03/27/24 Potential to Achieve Goals: Good    Frequency BID     Co-evaluation               AM-PAC PT "6 Clicks" Mobility  Outcome Measure Help needed turning from your back to your side while in a flat bed without using bedrails?: A Little Help needed moving from lying on your back to sitting on the side of a flat bed without using bedrails?: A Little Help needed moving to and from a bed to a chair (including a wheelchair)?: A Little Help needed standing up from a chair using your arms (e.g., wheelchair or bedside chair)?: A Little Help needed to walk in hospital room?: A Little Help needed climbing 3-5 steps with a railing? : A Little 6 Click Score: 18    End of Session   Activity Tolerance: Patient tolerated treatment well;No increased pain Patient left: in bed;with call bell/phone within reach;with family/visitor present Nurse Communication: Mobility status PT Visit Diagnosis: Difficulty in walking, not elsewhere classified (R26.2);Other abnormalities of gait and mobility (R26.89);Muscle weakness (generalized) (M62.81)    Time: 1610-9604 PT Time Calculation (min) (ACUTE ONLY): 45 min   Charges:   PT Evaluation $PT Eval Low Complexity: 1 Low PT Treatments $Therapeutic Activity: 8-22 mins PT General Charges $$ ACUTE PT VISIT: 1 Visit    4:52 PM, 03/13/24 Dawn Eth, PT, DPT Physical Therapist - [facility]  [phone] (ASCOM)       Little Bashore C 03/13/2024, 4:50 PM

## 2024-03-13 NOTE — Plan of Care (Signed)
  Problem: Pain Managment: Goal: General experience of comfort will improve and/or be controlled Outcome: Progressing   Problem: Safety: Goal: Ability to remain free from injury will improve Outcome: Progressing

## 2024-03-13 NOTE — Anesthesia Preprocedure Evaluation (Addendum)
 Anesthesia Evaluation  Patient identified by MRN, date of birth, ID band Patient awake    Reviewed: Allergy & Precautions, NPO status , Patient's Chart, lab work & pertinent test results  History of Anesthesia Complications Negative for: history of anesthetic complications  Airway Mallampati: III   Neck ROM: Full    Dental no notable dental hx.    Pulmonary sleep apnea and Continuous Positive Airway Pressure Ventilation    Pulmonary exam normal breath sounds clear to auscultation       Cardiovascular hypertension, Normal cardiovascular exam+ dysrhythmias (a fib on Eliquis , last dose 03/09/24)  Rhythm:Regular Rate:Normal  ECG 01/09/24: a fib, rate 74 bpm  Echo 12/02/21: NORMAL LEFT VENTRICULAR SYSTOLIC FUNCTION NORMAL RIGHT VENTRICULAR SYSTOLIC FUNCTION NO VALVULAR STENOSIS MILD to MODERATE PR TRIVIAL MR, TR EF >55%  Myocardial Perfusion 01/17/24:   Normal myocardial perfusion scan no evidence of stress-induced myocardial ischemia ejection fraction of 53% conclusion negative scan this is a low risk study      Neuro/Psych  PSYCHIATRIC DISORDERS Anxiety Depression     Neuromuscular disease (diabetic polyneuropathy)    GI/Hepatic negative GI ROS,,,  Endo/Other  diabetes, Type 2  Conn syndrome; obesity   Renal/GU negative Renal ROS     Musculoskeletal   Abdominal   Peds  Hematology negative hematology ROS (+)   Anesthesia Other Findings Last dose of Mounjaro 03/03/24.  Reviewed and agree with Donnetta Gains pre-anesthesia clinical review note.    Cardiology note 01/09/24:  58 y.o. male with  1. Atrial flutter, paroxysmal , unspecified(CMS-HCC)  2. Equivalent angina (CMS-HCC)  3. OSA (obstructive sleep apnea)  4. Class 1 obesity due to excess calories with body mass index (BMI) of 33.0 to 33.9 in adult, unspecified whether serious comorbidity present  5. Type 2 diabetes mellitus with diabetic polyneuropathy, without  long-term current use of insulin  (CMS/HHS-HCC)  6. Hypertension associated with diabetes (CMS/HHS-HCC)  7. Hyperlipidemia associated with type 2 diabetes mellitus , unspecified (CMS-HCC)   Plan   Atrial flutter atrial fibrillation, hx of ablation, hx cardioversion, ordered EKG for further assessment of presence of a-fib. Recommend consider repeat cardioversion and referral back to EP for further management Angina, ischemia workup with Myoview study patient unable to exercise because of knee and hip discomfort so we will provide a chemical stress Obstructive sleep apnea patient states to be compliant with his CPAP feels that show is very helpful for energy sleep and we have advised him to potentially undergo some measure of weight loss that may also be of some benefit,  Obesity, recommend weight loss, exercise, and portion control Diabetes, continue insulin  therapy, actos , synjardy  follow-up with primary physician or endocrinology. Would recommend ACE or ARB for renal protection since patient is diabetic we will recommend a very low-dose of lisinopril  2.5 daily Hypertension, 140/82 today, continue metoprolol ,  Hyperlipidemia, continue Crestor  therapy for lipid management   Return in about 6 months (around 07/08/2024).    Reproductive/Obstetrics                             Anesthesia Physical Anesthesia Plan  ASA: 3  Anesthesia Plan: Spinal   Post-op Pain Management:    Induction: Intravenous  PONV Risk Score and Plan: 2 and Propofol  infusion, TIVA, Treatment may vary due to age or medical condition and Ondansetron   Airway Management Planned: Natural Airway and Nasal Cannula  Additional Equipment:   Intra-op Plan:   Post-operative Plan:   Informed Consent: I  have reviewed the patients History and Physical, chart, labs and discussed the procedure including the risks, benefits and alternatives for the proposed anesthesia with the patient or authorized  representative who has indicated his/her understanding and acceptance.       Plan Discussed with: CRNA  Anesthesia Plan Comments: (Plan for spinal and GA with natural airway, LMA/GETA backup.  Patient consented for risks of anesthesia including but not limited to:  - adverse reactions to medications - damage to eyes, teeth, lips or other oral mucosa - nerve damage due to positioning  - sore throat or hoarseness - headache, bleeding, infection, nerve damage 2/2 spinal - damage to heart, brain, nerves, lungs, other parts of body or loss of life  Informed patient about role of CRNA in peri- and intra-operative care.  Patient voiced understanding.)        Anesthesia Quick Evaluation

## 2024-03-13 NOTE — Progress Notes (Signed)
 Patient is not able to walk the distance required to go the bathroom, or he/she is unable to safely negotiate stairs required to access the bathroom.  A 3in1 BSC will alleviate this problem   Amenda Duclos P. Angie Fava M.D.

## 2024-03-13 NOTE — Transfer of Care (Signed)
 Immediate Anesthesia Transfer of Care Note  Patient: Cory Perez  Procedure(s) Performed: ARTHROPLASTY, HIP, TOTAL,POSTERIOR APPROACH (Right: Hip)  Patient Location: PACU  Anesthesia Type:Spinal  Level of Consciousness: drowsy and patient cooperative  Airway & Oxygen Therapy: Patient Spontanous Breathing and Patient connected to face mask oxygen  Post-op Assessment: Report given to RN and Post -op Vital signs reviewed and stable  Post vital signs: Reviewed and stable  Last Vitals:  Vitals Value Taken Time  BP 103/70 03/13/24 1133  Temp    Pulse 69 03/13/24 1133  Resp 7 03/13/24 1133  SpO2 100 % 03/13/24 1133  Vitals shown include unfiled device data.  Last Pain:  Vitals:   03/13/24 0620  TempSrc: Temporal  PainSc: 0-No pain         Complications: No notable events documented.

## 2024-03-13 NOTE — Anesthesia Postprocedure Evaluation (Signed)
 Anesthesia Post Note  Patient: Cory Perez  Procedure(s) Performed: ARTHROPLASTY, HIP, TOTAL,POSTERIOR APPROACH (Right: Hip)  Patient location during evaluation: PACU Anesthesia Type: Spinal Level of consciousness: oriented and awake and alert Pain management: pain level controlled Vital Signs Assessment: post-procedure vital signs reviewed and stable Respiratory status: spontaneous breathing, respiratory function stable and patient connected to nasal cannula oxygen Cardiovascular status: blood pressure returned to baseline and stable Postop Assessment: no headache, no backache and no apparent nausea or vomiting Anesthetic complications: no   No notable events documented.   Last Vitals:  Vitals:   03/13/24 1133 03/13/24 1145  BP: 103/70 (!) 109/59  Pulse:  70  Resp: 17 13  Temp:    SpO2: 100% 100%    Last Pain:  Vitals:   03/13/24 1145  TempSrc:   PainSc: 0-No pain                 Dorothey Gate

## 2024-03-13 NOTE — Interval H&P Note (Signed)
 History and Physical Interval Note:  03/13/2024 6:08 AM  Cory Perez  has presented today for surgery, with the diagnosis of PRIMARY OSTEOARTHRITIS OF RIGHT HIP.Aaron Aas  The various methods of treatment have been discussed with the patient and family. After consideration of risks, benefits and other options for treatment, the patient has consented to  Procedure(s): ARTHROPLASTY, HIP, TOTAL,POSTERIOR APPROACH (Right) as a surgical intervention.  The patient's history has been reviewed, patient examined, no change in status, stable for surgery.  I have reviewed the patient's chart and labs.  Questions were answered to the patient's satisfaction.     Ubaldo Daywalt P Alecxis Baltzell

## 2024-03-13 NOTE — Op Note (Signed)
 OPERATIVE NOTE  DATE OF SURGERY:  03/13/2024  PATIENT NAME:  Cory Perez   DOB: [birth year]  MRN: [id number]  PRE-OPERATIVE DIAGNOSIS: Degenerative arthrosis of the right hip, primary  POST-OPERATIVE DIAGNOSIS:  Same  PROCEDURE:  Right total hip arthroplasty  SURGEON:  Maxene Span. M.D.  ASSISTANT:  Benjiman Bras, PA-C (present and scrubbed throughout the case, critical for assistance with exposure, retraction, instrumentation, and closure)  ANESTHESIA: spinal  ESTIMATED BLOOD LOSS: 100 mL  FLUIDS REPLACED: 700 mL of crystalloid  DRAINS: 2 medium Hemovac drains  IMPLANTS UTILIZED: DePuy size 7 standard offset Actis femoral stem, 58 mm OD Pinnacle 100 acetabular component, +4 mm degree Pinnacle Marathon polyethylene insert, and a 36 mm M-SPEC +1.5 mm hip ball  INDICATIONS FOR SURGERY: Cory Perez is a 58 y.o. year old male with a long history of progressive hip and groin  pain. X-rays demonstrated severe degenerative changes. The patient had not seen any significant improvement despite conservative nonsurgical intervention. After discussion of the risks and benefits of surgical intervention, the patient expressed understanding of the risks benefits and agree with plans for total hip arthroplasty.   The risks, benefits, and alternatives were discussed at length including but not limited to the risks of infection, bleeding, nerve injury, stiffness, blood clots, the need for revision surgery, limb length inequality, dislocation, cardiopulmonary complications, among others, and they were willing to proceed.  PROCEDURE IN DETAIL: The patient was brought into the operating room and, after adequate spinal anesthesia was achieved, the patient was placed in a left lateral decubitus position. Axillary roll was placed and all bony prominences were well-padded. The patient's right hip was cleaned and prepped with alcohol and DuraPrep and draped in the usual sterile fashion. A "timeout" was  performed as per usual protocol. A lateral curvilinear incision was made gently curving towards the posterior superior iliac spine. The IT band was incised in line with the skin incision and the fibers of the gluteus maximus were split in line. The piriformis tendon was identified, skeletonized, and incised at its insertion to the proximal femur and reflected posteriorly. A T type posterior capsulotomy was performed. Prior to dislocation of the femoral head, a threaded Steinmann pin was inserted through a separate stab incision into the pelvis superior to the acetabulum and bent in the form of a stylus so as to assess limb length and hip offset throughout the procedure. The femoral head was then dislocated posteriorly. Inspection of the femoral head demonstrated severe degenerative changes with full-thickness loss of articular cartilage. The femoral neck cut was performed using an oscillating saw. The anterior capsule was elevated off of the femoral neck using a periosteal elevator. Attention was then directed to the acetabulum. The remnant of the labrum was excised using electrocautery. Inspection of the acetabulum also demonstrated significant degenerative changes. The acetabulum was reamed in sequential fashion up to a 57 mm diameter. Good punctate bleeding bone was encountered. A 58 mm Pinnacle 100 acetabular component was positioned and impacted into place. Good scratch fit was appreciated. A +4 mm neutral polyethylene trial was inserted.  Attention was then directed to the proximal femur.  Femoral broaches were inserted in a sequential fashion up to a size 7 broach. Calcar region was planed and a trial reduction was performed using a standard offset neck and a 36 mm hip ball with a +1.5 mm neck length.  Reasonably good stability was noted but it was elected to trial with a +4 mm 10 degree  trial liner with the high side position at the 8 o'clock position.  Good equalization of limb lengths and hip offset was  appreciated and excellent stability was noted both anteriorly and posteriorly. Trial components were removed. The acetabular shell was irrigated with copious amounts of normal saline with antibiotic solution and suctioned dry. A +4 mm 10 degree Pinnacle Marathon polyethylene insert was positioned with the high side at the 8 o'clock position and impacted into place. Next, a size 7 standard offset Actis femoral stem was positioned and impacted into place. Excellent scratch fit was appreciated. A trial reduction was again performed with a 36 mm hip ball with a +1.5 mm neck length. Again, good equalization of limb lengths was appreciated and excellent stability appreciated both anteriorly and posteriorly. The hip was then dislocated and the trial hip ball was removed. The Morse taper was cleaned and dried. A 36 mm M-SPEC hip ball with a +1.5 mm neck length was placed on the trunnion and impacted into place. The hip was then reduced and placed through range of motion. Excellent stability was appreciated both anteriorly and posteriorly.  The wound was irrigated with copious amounts of normal saline followed by 450 ml of Surgiphor and suctioned dry. Good hemostasis was appreciated after application of Hemoblast. The posterior capsulotomy was repaired using #5 Ethibond. Piriformis tendon was reapproximated to the undersurface of the gluteus medius tendon using #5 Ethibond. The IT band was reapproximated using interrupted sutures of #1 Vicryl. Subcutaneous tissue was approximated using first #0 Vicryl followed by #2-0 Vicryl. The skin was closed with skin staples.  The patient tolerated the procedure well and was transported to the recovery room in stable condition.   Maxene Span., M.D.

## 2024-03-14 ENCOUNTER — Encounter: Payer: Self-pay | Admitting: Orthopedic Surgery

## 2024-03-14 DIAGNOSIS — M1611 Unilateral primary osteoarthritis, right hip: Secondary | ICD-10-CM | POA: Diagnosis not present

## 2024-03-14 LAB — GLUCOSE, CAPILLARY: Glucose-Capillary: 162 mg/dL — ABNORMAL HIGH (ref 70–99)

## 2024-03-14 MED ORDER — CELECOXIB 200 MG PO CAPS: 200.0000 mg | ORAL_CAPSULE | Freq: Two times a day (BID) | ORAL | 1 refills | Status: AC

## 2024-03-14 MED ORDER — OXYCODONE HCL 5 MG PO TABS: 5.0000 mg | ORAL_TABLET | ORAL | 0 refills | Status: AC | PRN

## 2024-03-14 MED ORDER — TRAMADOL HCL 50 MG PO TABS: 50.0000 mg | ORAL_TABLET | ORAL | 0 refills | Status: AC | PRN

## 2024-03-14 MED ORDER — METFORMIN HCL ER 500 MG PO TB24
ORAL_TABLET | ORAL | Status: AC
Start: 2024-03-14 — End: ?
  Filled 2024-03-14: qty 2

## 2024-03-14 NOTE — Progress Notes (Signed)
 Occupational Therapy Evaluation Patient Details Name: Cory Perez MRN: [id number] DOB: 05-Nov-1966 Today's Date: 03/14/2024   History of Present Illness   58yoM presenting for Rt elective posterior THA. PMH: Left partial knee arthroplaty, L3-S1 decompression, AF s/p ablation, angina, HTN, DM2, OSA on CPAP.     Clinical Impressions Pt seen for OT evaluation this date, POD#1 from above surgery. Pt was independent in all ADLs/IADLs prior to surgery, lives with his wife who is able to assist him as needed once discharged home. Pt is eager to return to PLOF with less pain and improved safety and independence. Pt currently requires minimal assist for LB dressing while in seated position due to pain and limited AROM of  hip. Pt able to recall 3/3 posterior total hip precautions at start of session. Pt completed UB/LB dressing sit/standing position, CGA/supervision with RW for support as needed. MINA LB dressing only due to hip precautions. Pt amb into bathroom with CGA-progressed to supervision with demonstrated stability with RW, toilet transfer completed with good adherence to precautions. Sink level ADLs completed with supervision. Pt instructed in posterior total hip precautions and how to implement, self care skills, falls prevention strategies, home/routines modifications, DME/AE for LB bathing and dressing tasks, compression stocking mgt strategies, and car transfer techniques. Do not anticipate the need for follow up OT services upon acute hospital DC. OT will sign off.      If plan is discharge home, recommend the following:   A little help with bathing/dressing/bathroom     Functional Status Assessment   Patient has not had a recent decline in their functional status     Equipment Recommendations   None recommended by OT     Recommendations for Other Services         Precautions/Restrictions   Precautions Precautions: Fall Recall of Precautions/Restrictions:  Intact Restrictions Weight Bearing Restrictions Per Provider Order: Yes RLE Weight Bearing Per Provider Order: Weight bearing as tolerated     Mobility Bed Mobility               General bed mobility comments: NT recliner pre/post session    Transfers Overall transfer level: Needs assistance Equipment used: Rolling walker (2 wheels) Transfers: Sit to/from Stand, Bed to chair/wheelchair/BSC Sit to Stand: Supervision                  Balance Overall balance assessment: Needs assistance Sitting-balance support: Feet supported, No upper extremity supported       Standing balance support: Single extremity supported, Reliant on assistive device for balance, During functional activity                               ADL either performed or assessed with clinical judgement   ADL Overall ADL's : Needs assistance/impaired     Grooming: Supervision/safety;Sitting;Oral care;Wash/dry face           Upper Body Dressing : Contact guard assist;Standing   Lower Body Dressing: Minimal assistance;Sit to/from stand   Toilet Transfer: Ambulation;Rolling walker (2 wheels);Contact guard assist           Functional mobility during ADLs: Contact guard assist;Supervision/safety;Rolling walker (2 wheels) (Initially CGA progressed to supervision) General ADL Comments: UB/LB dressing sit/sand with CGA when standing, MINA LB dressing, sink level grooming tasks with supervision     Vision Baseline Vision/History: 1 Wears glasses  Pertinent Vitals/Pain Pain Assessment Pain Assessment: Faces Faces Pain Scale: No hurt     Extremity/Trunk Assessment Upper Extremity Assessment Upper Extremity Assessment: Generalized weakness   Lower Extremity Assessment Lower Extremity Assessment: Generalized weakness;RLE deficits/detail RLE Deficits / Details: Anticipated posterior R Hip arthoplasty       Communication  Communication Communication: No apparent difficulties   Cognition Arousal: Alert Behavior During Therapy: WFL for tasks assessed/performed Cognition: No apparent impairments             OT - Cognition Comments: A/Ox4                 Following commands: Intact       Cueing  General Comments   Cueing Techniques: Verbal cues  Post op dressing D/C/I   Exercises Exercises: Other exercises Other Exercises Other Exercises: Edu: Role of OT, posterior hip precaution review, safe ADL completion, DME management, LB dressing technique   Shoulder Instructions      Home Living Family/patient expects to be discharged to:: Private residence Living Arrangements: Spouse/significant other Available Help at Discharge: Family Type of Home: House Home Access: Stairs to enter Secretary/administrator of Steps: 2 Entrance Stairs-Rails: Right;Left;Can reach both Home Layout: One level     Bathroom Shower/Tub: Walk-in shower         Home Equipment: Jeananne Mighty - single point          Prior Functioning/Environment Prior Level of Function : Independent/Modified Independent;Working/employed;Driving             Mobility Comments: Indep ADLs Comments: Indep    OT Problem List:     OT Treatment/Interventions:        OT Goals(Current goals can be found in the care plan section)   Acute Rehab OT Goals Patient Stated Goal: return home OT Goal Formulation: With patient Time For Goal Achievement: 03/28/24 Potential to Achieve Goals: Good   OT Frequency:       Co-evaluation              AM-PAC OT "6 Clicks" Daily Activity     Outcome Measure Help from another person eating meals?: None Help from another person taking care of personal grooming?: None Help from another person toileting, which includes using toliet, bedpan, or urinal?: None Help from another person bathing (including washing, rinsing, drying)?: A Little Help from another person to put on and taking  off regular upper body clothing?: None Help from another person to put on and taking off regular lower body clothing?: A Little 6 Click Score: 22   End of Session Equipment Utilized During Treatment: Gait belt;Rolling walker (2 wheels) Nurse Communication: Mobility status  Activity Tolerance: Patient tolerated treatment well Patient left: in chair;with call bell/phone within reach;with family/visitor present                   Time: 1610-9604 OT Time Calculation (min): 21 min Charges:  OT General Charges $OT Visit: 1 Visit OT Evaluation $OT Eval Low Complexity: 1 Low OT Treatments $Self Care/Home Management : 8-22 mins  Rosaria Common M.S. OTR/L  03/14/24, 1:15 PM

## 2024-03-14 NOTE — Progress Notes (Signed)
 Subjective: 1 Day Post-Op Procedure(s) (LRB): ARTHROPLASTY, HIP, TOTAL,POSTERIOR APPROACH (Right) Patient reports pain as mild.   Patient seen in rounds with Dr. Aubry Blase. Patient is well, and has had no acute complaints or problems. Denies any CP, SOB, N/v, fevers or chills We will start therapy today.  Plan is to go Home after hospital stay.  Objective: Vital signs in last 24 hours: Temp:  [97 F (36.1 C)-98.4 F (36.9 C)] 98.4 F (36.9 C) (04/24 0329) Pulse Rate:  [60-84] 60 (04/24 0329) Resp:  [10-20] 18 (04/24 0329) BP: (103-138)/(59-79) 122/73 (04/24 0329) SpO2:  [94 %-100 %] 96 % (04/24 0329)  Intake/Output from previous day:  Intake/Output Summary (Last 24 hours) at 03/14/2024 0804 Last data filed at 03/14/2024 1610 Gross per 24 hour  Intake 2716.67 ml  Output 2110 ml  Net 606.67 ml    Intake/Output this shift: No intake/output data recorded.  Labs: No results for input(s): "HGB" in the last 72 hours. No results for input(s): "WBC", "RBC", "HCT", "PLT" in the last 72 hours. No results for input(s): "NA", "K", "CL", "CO2", "BUN", "CREATININE", "GLUCOSE", "CALCIUM " in the last 72 hours. No results for input(s): "LABPT", "INR" in the last 72 hours.  EXAM General - Patient is Alert, Appropriate, and Oriented Extremity - Neurologically intact Neurovascular intact Sensation intact distally Intact pulses distally Dorsiflexion/Plantar flexion intact No cellulitis present Compartment soft Dressing - dressing C/D/I and no drainage Motor Function - intact, moving foot and toes well on exam. JP Drain pulled without difficulty. Intact  Past Medical History:  Diagnosis Date   Aldosterone excess (Conn syndrome) (HCC)    Anginal pain (HCC)    Anxiety    Aortic atherosclerosis (HCC)    Atrial fibrillation and flutter (HCC)    a.) CHA2DS2VASc = 3 (HTN, vascular disease, T2DM) as of 03/08/2024;  b.) s/p ablation 07/13/2016; c.) s/p DCCV 10/11/2022 --> 120 J x 1 --> NSR; d.)  rate/rhythm maintained on oral metoprolol  succinate; chronically anticoagulated with apixaban    CAD (coronary artery disease)    Chronic seasonal allergic rhinitis    Depression    Diabetic polyneuropathy (HCC)    Diastolic dysfunction 10/21/2020   a.) TTE 10/21/2020: EF >55%, mild LVH, mod BAE, triv AR/MR, mild TR/PR, G2DD   DJD (degenerative joint disease)    DM2 (diabetes mellitus, type 2) (HCC)    History of chicken pox    History of syncope    HLD (hyperlipidemia)    HTN (hypertension)    Hx of atrioventricular node ablation 07/13/2016   Hyperaldosteronism (HCC)    Hypersomnia with sleep apnea    a.) on nocturnal PAP therapy   Long-term current use of immunosuppressive biologic agent    a.) bimekizumab-bkzx (BIMZELX) for psoriasis   Lumbar spinal stenosis    Neuropathy    Obesity    On apixaban  therapy    OSA on CPAP    Osteoarthritis of right hip    Psoriasis    a.) Tx'd with bimekizumab-bkzx (BIMZELX)   Seasonal allergies    Seborrheic dermatitis    Thrombocytopenia (HCC)    Vitamin B12 deficiency    Vitamin D deficiency     Assessment/Plan: 1 Day Post-Op Procedure(s) (LRB): ARTHROPLASTY, HIP, TOTAL,POSTERIOR APPROACH (Right) Principal Problem:   Hx of total hip arthroplasty, right  Estimated body mass index is 32.36 kg/m as calculated from the following:   Height as of this encounter: 6\' 6"  (1.981 m).   Weight as of this encounter: 127 kg. Advance diet Up  with therapy  Patient will continue to work with physical therapy to pass postoperative PT protocols, ROM and strengthening  Hip Preacutions  Discussed with the patient continuing to utilize ice over the bandage  Patient will wear TED hose bilaterally to help prevent DVT and clot formation  Discussed the Aquacel bandage.  This bandage will stay in place 7 days postoperatively.  Can be replaced with honeycomb bandages that will be sent home with the patient  Discussed sending the patient home with  tramadol  and oxycodone  for as needed pain management.  Patient will also be sent home with Celebrex  to help with swelling and inflammation.  Patient will continue on at home elequis for DVT prophylaxis  JP drain removed without difficulty, intact  Weight-Bearing as tolerated to right leg  Patient will follow-up with [facility] clinic orthopedics in 6 weeks for re-imaging and reevaluation   Wadie Guile, PA-C [facility] 03/14/2024, 8:04 AM

## 2024-03-14 NOTE — Discharge Summary (Signed)
 Physician Discharge Summary  Subjective: 1 Day Post-Op Procedure(s) (LRB): ARTHROPLASTY, HIP, TOTAL,POSTERIOR APPROACH (Right) Patient reports pain as mild.   Patient seen in rounds with Dr. Aubry Blase. Patient is well, and has had no acute complaints or problems. Denies any CP, SOB, N/v, fevers or chills We will start therapy today.  Patient is ready to go home  Physician Discharge Summary  Patient ID: Cory Perez MRN: [id number] DOB/AGE: 60-Nov-1967 60 y.o.  Admit date: 03/13/2024 Discharge date: 03/14/2024  Admission Diagnoses:  Discharge Diagnoses:  Principal Problem:   Hx of total hip arthroplasty, right   Discharged Condition: good  Hospital Course: Patient presented to the hospital on/20 01/2024 for an elective right total hip arthroplasty performed by Dr. Aubry Blase. Patient was given 1g of TXA and 2g of Ancef  prior to the procedure. he  tolerated the procedure well without any complications, Hemoblast was used. See procedural note below for details. Postoperatively, the patient did very well. he  was able to pass PT protocols on post-op day one without any issues. JP drain was removed without any difficulty and was intact. he  was able to void his bladder without any difficulty. Physical exam was unremarkable. he  denies any SOB, CP, N/V, fevers or chills. Vital signs are stable. Patient is stable to discharge home.  PRE-OPERATIVE DIAGNOSIS: Degenerative arthrosis of the right hip, primary   POST-OPERATIVE DIAGNOSIS:  Same   PROCEDURE:  Right total hip arthroplasty   SURGEON:  Maxene Span. M.D.   ASSISTANT:  Benjiman Bras, PA-C (present and scrubbed throughout the case, critical for assistance with exposure, retraction, instrumentation, and closure)   ANESTHESIA: spinal   ESTIMATED BLOOD LOSS: 100 mL   FLUIDS REPLACED: 700 mL of crystalloid   DRAINS: 2 medium Hemovac drains   IMPLANTS UTILIZED: DePuy size 7 standard offset Actis femoral stem, 58 mm OD Pinnacle 100  acetabular component, +4 mm degree Pinnacle Marathon polyethylene insert, and a 36 mm M-SPEC +1.5 mm hip ball  Treatments: None  Discharge Exam: Blood pressure (!) 118/59, pulse 67, temperature 98 F (36.7 C), temperature source Oral, resp. rate 15, height 6\' 6"  (1.981 m), weight 127 kg, SpO2 99%.   Disposition: Home   Allergies as of 03/14/2024       Reactions   Ketoconazole Hives        Medication List     TAKE these medications    acetaminophen  500 MG tablet Commonly known as: TYLENOL  Take 1,000 mg by mouth every 4 (four) hours as needed (pain.).   baclofen  10 MG tablet Commonly known as: LIORESAL  Take 10 mg by mouth 3 (three) times daily as needed.   Bimzelx 160 MG/ML prefilled syringe Generic drug: bimekizumab-bkzx Inject 320 mg into the skin every 28 (twenty-eight) days.   celecoxib  200 MG capsule Commonly known as: CELEBREX  Take 1 capsule (200 mg total) by mouth 2 (two) times daily.   chlorhexidine  4 % external liquid Commonly known as: HIBICLENS  Apply 15 mLs (1 Application total) topically as directed for 30 doses. Use as directed daily for 5 days every other week for 6 weeks.   cyanocobalamin 1000 MCG tablet Commonly known as: VITAMIN B12 Take 1,000 mcg by mouth in the morning.   Eliquis  5 MG Tabs tablet Generic drug: apixaban  Take 5 mg by mouth 2 (two) times daily.   fexofenadine 180 MG tablet Commonly known as: ALLEGRA Take 180 mg by mouth in the morning.   fluticasone  50 MCG/ACT nasal spray Commonly known as: FLONASE   Place 2 sprays into both nostrils at bedtime.   Insulin  Pen Needle 31G X 5 MM Misc by Does not apply route. Could not find exact match. See [facility] chart for where it is listed.   lisinopril  2.5 MG tablet Commonly known as: ZESTRIL  Take 2.5 mg by mouth in the morning.   metoprolol  succinate 50 MG 24 hr tablet Commonly known as: TOPROL -XL Take 50 mg by mouth in the morning. Take with or immediately following a meal.    metoprolol  tartrate 100 MG tablet Commonly known as: LOPRESSOR  Take tablet (100mg ) TWO hours prior to your cardiac CT scan.   Mounjaro 10 MG/0.5ML Pen Generic drug: tirzepatide Inject 10 mg into the skin every Sunday.   mupirocin  ointment 2 % Commonly known as: BACTROBAN  Place 1 Application into the nose 2 (two) times daily for 60 doses. Use as directed 2 times daily for 5 days every other week for 6 weeks.   oxyCODONE  5 MG immediate release tablet Commonly known as: Oxy IR/ROXICODONE  Take 1 tablet (5 mg total) by mouth every 4 (four) hours as needed for moderate pain (pain score 4-6) (pain score 4-6).   pioglitazone  30 MG tablet Commonly known as: ACTOS  Take 30 mg by mouth in the morning.   rosuvastatin  40 MG tablet Commonly known as: CRESTOR  Take 40 mg by mouth in the morning.   sertraline  100 MG tablet Commonly known as: ZOLOFT  Take 200 mg by mouth at bedtime.   Synjardy  XR 12.03-999 MG Tb24 Generic drug: Empagliflozin -metFORMIN  HCl ER Take 2 tablets by mouth in the morning.   traMADol  50 MG tablet Commonly known as: ULTRAM  Take 1-2 tablets (50-100 mg total) by mouth every 4 (four) hours as needed for moderate pain (pain score 4-6).   Vitamin D (Ergocalciferol) 1.25 MG (50000 UNIT) Caps capsule Commonly known as: DRISDOL Take 50,000 Units by mouth every Saturday.               Durable Medical Equipment  (From admission, onward)           Start     Ordered   03/13/24 1334  DME Walker rolling  Once       Question:  Patient needs a walker to treat with the following condition  Answer:  S/P total hip arthroplasty   03/13/24 1333   03/13/24 1334  DME Bedside commode  Once       Comments: Patient is not able to walk the distance required to go the bathroom, or he/she is unable to safely negotiate stairs required to access the bathroom.  A 3in1 BSC will alleviate this problem  Question:  Patient needs a bedside commode to treat with the following condition   Answer:  S/P total hip arthroplasty   03/13/24 1333            Follow-up Information     Hooten, Robbie Chiles, MD Follow up on 04/25/2024.   Specialty: Orthopedic Surgery Why: at 2:00PM Contact information: [address] [facility] [place] [address] [phone]                 Signed: Benjiman Bras 03/14/2024, 8:23 AM   Objective: Vital signs in last 24 hours: Temp:  [97 F (36.1 C)-98.4 F (36.9 C)] 98 F (36.7 C) (04/24 0807) Pulse Rate:  [60-84] 67 (04/24 0807) Resp:  [10-20] 15 (04/24 0807) BP: (103-138)/(59-79) 118/59 (04/24 0807) SpO2:  [94 %-100 %] 99 % (04/24 0807)  Intake/Output from previous day:  Intake/Output  Summary (Last 24 hours) at 03/14/2024 0823 Last data filed at 03/14/2024 9629 Gross per 24 hour  Intake 2716.67 ml  Output 2110 ml  Net 606.67 ml    Intake/Output this shift: No intake/output data recorded.  Labs: No results for input(s): "HGB" in the last 72 hours. No results for input(s): "WBC", "RBC", "HCT", "PLT" in the last 72 hours. No results for input(s): "NA", "K", "CL", "CO2", "BUN", "CREATININE", "GLUCOSE", "CALCIUM " in the last 72 hours. No results for input(s): "LABPT", "INR" in the last 72 hours.  EXAM: General - Patient is Alert, Appropriate, and Oriented Extremity - Neurologically intact Neurovascular intact Sensation intact distally Intact pulses distally Dorsiflexion/Plantar flexion intact No cellulitis present Compartment soft Dressing - dressing C/D/I and no drainage Motor Function - intact, moving foot and toes well on exam. JP Drain pulled without difficulty. Intact  Assessment/Plan: 1 Day Post-Op Procedure(s) (LRB): ARTHROPLASTY, HIP, TOTAL,POSTERIOR APPROACH (Right) Procedure(s) (LRB): ARTHROPLASTY, HIP, TOTAL,POSTERIOR APPROACH (Right) Past Medical History:  Diagnosis Date   Aldosterone excess (Conn syndrome) (HCC)    Anginal pain (HCC)    Anxiety    Aortic atherosclerosis (HCC)     Atrial fibrillation and flutter (HCC)    a.) CHA2DS2VASc = 3 (HTN, vascular disease, T2DM) as of 03/08/2024;  b.) s/p ablation 07/13/2016; c.) s/p DCCV 10/11/2022 --> 120 J x 1 --> NSR; d.) rate/rhythm maintained on oral metoprolol  succinate; chronically anticoagulated with apixaban    CAD (coronary artery disease)    Chronic seasonal allergic rhinitis    Depression    Diabetic polyneuropathy (HCC)    Diastolic dysfunction 10/21/2020   a.) TTE 10/21/2020: EF >55%, mild LVH, mod BAE, triv AR/MR, mild TR/PR, G2DD   DJD (degenerative joint disease)    DM2 (diabetes mellitus, type 2) (HCC)    History of chicken pox    History of syncope    HLD (hyperlipidemia)    HTN (hypertension)    Hx of atrioventricular node ablation 07/13/2016   Hyperaldosteronism (HCC)    Hypersomnia with sleep apnea    a.) on nocturnal PAP therapy   Long-term current use of immunosuppressive biologic agent    a.) bimekizumab-bkzx (BIMZELX) for psoriasis   Lumbar spinal stenosis    Neuropathy    Obesity    On apixaban  therapy    OSA on CPAP    Osteoarthritis of right hip    Psoriasis    a.) Tx'd with bimekizumab-bkzx (BIMZELX)   Seasonal allergies    Seborrheic dermatitis    Thrombocytopenia (HCC)    Vitamin B12 deficiency    Vitamin D deficiency    Principal Problem:   Hx of total hip arthroplasty, right  Estimated body mass index is 32.36 kg/m as calculated from the following:   Height as of this encounter: 6\' 6"  (1.981 m).   Weight as of this encounter: 127 kg.  Patient will continue to work with physical therapy   Hip Preacutions   Discussed with the patient continuing to utilize ice over the bandage   Patient will wear TED hose bilaterally to help prevent DVT and clot formation   Discussed the Aquacel bandage.  This bandage will stay in place 7 days postoperatively.  Can be replaced with honeycomb bandages that will be sent home with the patient   Discussed sending the patient home with  tramadol  and oxycodone  for as needed pain management.  Patient will also be sent home with Celebrex  to help with swelling and inflammation.  Patient will continue on at home elequis  for DVT prophylaxis   JP drain removed without difficulty, intact   Weight-Bearing as tolerated to right leg   Patient will follow-up with [facility] clinic orthopedics in 6 weeks for re-imaging and reevaluation  Diet - Regular diet Follow up - in 6 weeks Activity - WBAT Disposition - Home Condition Upon Discharge - Good DVT Prophylaxis - Elequis and Ted Hose  Standley Earing, PA-C Orthopaedic Surgery 03/14/2024, 8:23 AM

## 2024-03-14 NOTE — TOC Transition Note (Signed)
 Transition of Care [facility]) - Discharge Note   Patient Details  Name: Cory Perez MRN: [id number] Date of Birth: 27-Apr-1966  [facility] CM/SW Contact:  Alexandra Ice, RN Phone Number: 03/14/2024, 11:55 AM   Clinical Narrative:     Patient to discharge home with home health services today. [facility] set up with surgeon's office prior to surgery. [employer] delivered wide BSC to bedside.   Final next level of care: Home w Home Health Services Barriers to Discharge: Barriers Resolved   Patient Goals and CMS Choice            Discharge Placement                    Patient and family notified of of transfer: 03/14/24  Discharge Plan and Services Additional resources added to the After Visit Summary for       Post Acute Care Choice: Durable Medical Equipment          DME Arranged: Bedside commode DME Agency: [employer] Date DME Agency Contacted: 03/14/24 Time DME Agency Contacted: 1047 Representative spoke with at DME Agency: Montie Apley Arranged: PT, OT          Social Drivers of Health (SDOH) Interventions SDOH Screenings   Food Insecurity: No Food Insecurity (03/13/2024)  Housing: Low Risk  (03/13/2024)  Transportation Needs: No Transportation Needs (03/13/2024)  Utilities: Not At Risk (03/13/2024)  Financial Resource Strain: Low Risk  (02/15/2024)   Received from [facility]  Tobacco Use: Low Risk  (03/13/2024)     Readmission Risk Interventions     No data to display

## 2024-03-14 NOTE — Progress Notes (Signed)
 Physical Therapy Treatment/Discharge Patient Details Name: Cory Perez MRN: [id number] DOB: 1966-01-23 Today's Date: 03/14/2024   History of Present Illness 58yoM presenting for Rt elective posterior THA. PMH: Left partial knee arthroplaty, L3-S1 decompression, AF s/p ablation, angina, HTN, DM2, OSA on CPAP.    PT Comments  AMB advanced to >230ft with device, modI level Stairs training completed in preparation for DC  HEP education completed, handout issued Pt peforming all mobility safely, observes hip precautions, no assist needed Acute care PT goals of care met, pt to be DC from our services in preparation for transition back to home today   If plan is discharge home, recommend the following: Assist for transportation;Assistance with cooking/housework;Help with stairs or ramp for entrance;A little help with bathing/dressing/bathroom   Can travel by private vehicle        Equipment Recommendations  Other (comment) (tall/wide elongated BSC)    Recommendations for Other Services       Precautions / Restrictions Precautions Precautions: Fall Restrictions RLE Weight Bearing Per Provider Order: Weight bearing as tolerated     Mobility  Bed Mobility                    Transfers Overall transfer level: Modified independent Equipment used:  (bari RW from recliner) Transfers: Sit to/from Stand Sit to Stand: Modified independent (Device/Increase time)                Ambulation/Gait Ambulation/Gait assistance: Chief Operating Officer (Feet): 220 Feet Assistive device:  (baritallwalker) Gait Pattern/deviations: Step-to pattern Gait velocity: 0.74m/s     General Gait Details: unable to advance to 2-point gait pattern, however gait speed increases over distance   Stairs Stairs: Yes   Stair Management: Two rails, Step to pattern, Forwards Number of Stairs: 8 General stair comments: upgooddownbad   Wheelchair Mobility     Tilt Bed    Modified  Rankin (Stroke Patients Only)       Balance Overall balance assessment: Modified Independent                                          Communication    Cognition Arousal: Alert Behavior During Therapy: WFL for tasks assessed/performed   PT - Cognitive impairments: No apparent impairments                                Cueing    Exercises Total Joint Exercises Quad Sets: AROM, Right, 5 reps Gluteal Sets: AROM, Both, 5 reps Towel Squeeze: AROM, Both, 5 reps Short Arc Quad: AROM, 5 reps, Right Heel Slides: AAROM, Right, 10 reps Hip ABduction/ADduction: AAROM, Right, 10 reps Long Arc Quad: AROM, Right, 10 reps Goniometric ROM: 15-75 degrees Rt hip flexion in session.    General Comments        Pertinent Vitals/Pain Pain Assessment Pain Assessment: 0-10 Pain Score: 3  Pain Location: Rt lateral hip sting Pain Intervention(s): Limited activity within patient's tolerance, Monitored during session, Premedicated before session    Home Living                          Prior Function            PT Goals (current goals can now be found in the care plan section) Acute Rehab PT Goals  Patient Stated Goal: improve AMB distance before discharge PT Goal Formulation: With patient Time For Goal Achievement: 03/27/24 Potential to Achieve Goals: Good Progress towards PT goals: Goals met/education completed, patient discharged from PT    Frequency    BID      PT Plan      Co-evaluation              AM-PAC PT "6 Clicks" Mobility   Outcome Measure  Help needed turning from your back to your side while in a flat bed without using bedrails?: A Little Help needed moving from lying on your back to sitting on the side of a flat bed without using bedrails?: A Little Help needed moving to and from a bed to a chair (including a wheelchair)?: A Little Help needed standing up from a chair using your arms (e.g., wheelchair or bedside  chair)?: A Little Help needed to walk in hospital room?: A Little Help needed climbing 3-5 steps with a railing? : A Little 6 Click Score: 18    End of Session Equipment Utilized During Treatment: Gait belt Activity Tolerance: Patient tolerated treatment well;No increased pain Patient left: with call bell/phone within reach;in chair Nurse Communication: Mobility status PT Visit Diagnosis: Difficulty in walking, not elsewhere classified (R26.2);Other abnormalities of gait and mobility (R26.89);Muscle weakness (generalized) (M62.81)     Time: 1610-9604 PT Time Calculation (min) (ACUTE ONLY): 31 min  Charges:    $Gait Training: 8-22 mins $Therapeutic Activity: 8-22 mins PT General Charges $$ ACUTE PT VISIT: 1 Visit                    9:27 AM, 03/14/24 Cory Perez, PT, DPT Physical Therapist - [facility]  [phone] (ASCOM)     Cory Perez C 03/14/2024, 9:24 AM

## 2024-03-14 NOTE — Progress Notes (Signed)
 DISCHARGE NOTE:   Pt dc with IV removed and pt education given. Pt has both TED hose on and in place. Pt received 3 in 1 delivered to hospital room. Pt given CHG soap and medication scripts. Pt wheeled down to medical mall entrance by staff and pt's wife provided transportation.
